# Patient Record
Sex: Female | Born: 1950 | Race: Black or African American | Hispanic: No | Marital: Single | Smoking: Former smoker
Health system: Southern US, Community
[De-identification: ages and names within clinical notes are randomized; demographics above are authoritative.]

## PROBLEM LIST (undated history)

## (undated) DIAGNOSIS — I4510 Unspecified right bundle-branch block: Secondary | ICD-10-CM

## (undated) DIAGNOSIS — R7303 Prediabetes: Secondary | ICD-10-CM

## (undated) DIAGNOSIS — Z8489 Family history of other specified conditions: Secondary | ICD-10-CM

## (undated) DIAGNOSIS — J45909 Unspecified asthma, uncomplicated: Secondary | ICD-10-CM

## (undated) DIAGNOSIS — K219 Gastro-esophageal reflux disease without esophagitis: Secondary | ICD-10-CM

## (undated) DIAGNOSIS — E119 Type 2 diabetes mellitus without complications: Secondary | ICD-10-CM

## (undated) DIAGNOSIS — Z973 Presence of spectacles and contact lenses: Secondary | ICD-10-CM

## (undated) DIAGNOSIS — I10 Essential (primary) hypertension: Secondary | ICD-10-CM

## (undated) DIAGNOSIS — M1990 Unspecified osteoarthritis, unspecified site: Secondary | ICD-10-CM

## (undated) DIAGNOSIS — N939 Abnormal uterine and vaginal bleeding, unspecified: Secondary | ICD-10-CM

## (undated) HISTORY — PX: DILATION AND CURETTAGE OF UTERUS: SHX78

## (undated) HISTORY — PX: JOINT REPLACEMENT: SHX530

---

## 2002-08-10 ENCOUNTER — Encounter: Admission: RE | Admit: 2002-08-10 | Discharge: 2002-08-10 | Payer: Self-pay | Admitting: Obstetrics & Gynecology

## 2002-08-10 ENCOUNTER — Encounter: Payer: Self-pay | Admitting: Obstetrics & Gynecology

## 2005-01-19 ENCOUNTER — Encounter: Admission: RE | Admit: 2005-01-19 | Discharge: 2005-01-19 | Payer: Self-pay | Admitting: Internal Medicine

## 2006-03-29 ENCOUNTER — Encounter: Admission: RE | Admit: 2006-03-29 | Discharge: 2006-03-29 | Payer: Self-pay | Admitting: Internal Medicine

## 2007-10-18 ENCOUNTER — Encounter: Admission: RE | Admit: 2007-10-18 | Discharge: 2007-10-18 | Payer: Self-pay | Admitting: Internal Medicine

## 2010-12-17 ENCOUNTER — Inpatient Hospital Stay (HOSPITAL_COMMUNITY)
Admission: AD | Admit: 2010-12-17 | Discharge: 2010-12-17 | Payer: Self-pay | Source: Home / Self Care | Attending: Obstetrics and Gynecology | Admitting: Obstetrics and Gynecology

## 2010-12-21 LAB — CBC
HCT: 42.7 % (ref 36.0–46.0)
Hemoglobin: 14.4 g/dL (ref 12.0–15.0)
MCH: 27 pg (ref 26.0–34.0)
MCHC: 33.7 g/dL (ref 30.0–36.0)
MCV: 80.1 fL (ref 78.0–100.0)
Platelets: 221 10*3/uL (ref 150–400)
RBC: 5.33 MIL/uL — ABNORMAL HIGH (ref 3.87–5.11)
RDW: 15.7 % — ABNORMAL HIGH (ref 11.5–15.5)
WBC: 14.9 10*3/uL — ABNORMAL HIGH (ref 4.0–10.5)

## 2010-12-21 LAB — URINALYSIS, ROUTINE W REFLEX MICROSCOPIC
Bilirubin Urine: NEGATIVE
Ketones, ur: NEGATIVE mg/dL
Nitrite: NEGATIVE
Protein, ur: 100 mg/dL — AB
Specific Gravity, Urine: 1.025 (ref 1.005–1.030)
Urine Glucose, Fasting: NEGATIVE mg/dL
Urobilinogen, UA: 1 mg/dL (ref 0.0–1.0)
pH: 6 (ref 5.0–8.0)

## 2010-12-21 LAB — DIFFERENTIAL
Basophils Absolute: 0 10*3/uL (ref 0.0–0.1)
Basophils Relative: 0 % (ref 0–1)
Eosinophils Absolute: 0 10*3/uL (ref 0.0–0.7)
Eosinophils Relative: 0 % (ref 0–5)
Lymphocytes Relative: 14 % (ref 12–46)
Lymphs Abs: 2.1 10*3/uL (ref 0.7–4.0)
Monocytes Absolute: 1 10*3/uL (ref 0.1–1.0)
Monocytes Relative: 7 % (ref 3–12)
Neutro Abs: 11.8 10*3/uL — ABNORMAL HIGH (ref 1.7–7.7)
Neutrophils Relative %: 79 % — ABNORMAL HIGH (ref 43–77)

## 2010-12-21 LAB — URINE MICROSCOPIC-ADD ON

## 2010-12-26 ENCOUNTER — Encounter: Payer: Self-pay | Admitting: Internal Medicine

## 2010-12-27 ENCOUNTER — Encounter: Payer: Self-pay | Admitting: Internal Medicine

## 2011-01-01 ENCOUNTER — Other Ambulatory Visit: Payer: Self-pay | Admitting: Obstetrics and Gynecology

## 2011-01-01 ENCOUNTER — Other Ambulatory Visit
Admission: RE | Admit: 2011-01-01 | Discharge: 2011-01-01 | Payer: Self-pay | Source: Home / Self Care | Admitting: Obstetrics and Gynecology

## 2011-01-01 ENCOUNTER — Ambulatory Visit
Admission: RE | Admit: 2011-01-01 | Discharge: 2011-01-01 | Payer: Self-pay | Source: Home / Self Care | Attending: Obstetrics and Gynecology | Admitting: Obstetrics and Gynecology

## 2011-01-01 LAB — POCT PREGNANCY, URINE: Preg Test, Ur: NEGATIVE

## 2011-01-02 NOTE — Progress Notes (Signed)
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  The patient was seen in the MAU on December 17, 2010, after stopping her Depo-Provera with episodes of heavy vaginal bleeding, and lower abdominal cramping pain.  On that day the patient had an ultrasound which demonstrated a uterus measuring 12 x 6 x 7 cm.  The endometrial lining was homogeneous and measured 7.8 mm.  Endocervical canal was distended with a heterogeneous soft tissue measuring 4 x 8 cm which was suggestive of blood products or blood clots versus polyp or aborting submucosal fibroid.  The left and right ovaries were not visualized.  The patient was discharged from the MAU with a prescription for Provera today.  The patient states that she after taking the Provera her bleeding never really.  So she saw her primary care physician who gave her another dose of Depo-Provera and her bleeding has since stopped.  She reports occasional spotting, but a definite improvement since her MAU visit.  The patient is currently without any complaints.  PAST MEDICAL HISTORY:  Significant for asthma, hypertension, bronchitis, and arthritis.  PAST SURGICAL HISTORY:  She denies.  PAST OB HISTORY:  She has had four full-term vaginal deliveries.  PAST GYN HISTORY:  She denies any cyst or fibroids, but does report a history of abnormal Pap smear last year for which she was supposed to be followed up with colposcopy but failed to do so.  FAMILY HISTORY:  Significant for diabetes in her father and her mother deceased at the age of 48  from breast cancer.  SOCIAL HISTORY:  She denies drinking, smoking, or the use of illicit drugs.  REVIEW OF SYSTEMS:  Otherwise within normal limits.  The patient denies any vasomotor symptoms or irritability or mood swings.  PHYSICAL EXAMINATION:  VITAL SIGNS:  Her blood pressure is 133/91, pulse of 97, weight of 108.8 kg, height of 69 inches. ABDOMEN:  Soft, nontender, and nondistended. PELVIC:  She had normal-appearing vaginal mucosa and cervix which appeared very friable.  No abnormal bleeding or discharge.  Pelvic exam, she had approximately 14-week size uterus.  No palpable adnexal masses or tenderness.  ASSESSMENT/PLAN:  This is a 60 year old para 4 with longstanding history of dysfunctional uterine bleeding, medically managed on Depo-Provera who presents today as an MAU followup.  The patient was counseled for the need of endometrial biopsy to rule out a malignancy.  After informed consent was obtained, the patient was placed in dorsal lithotomy position and prepped with Betadine solution.  The uterine cavity was sounded to 9 cm and endometrial biopsy was obtained on first pass.  A Pap smear was performed prior to the endometrial biopsy given her history of abnormal Pap  smear with lack of followup.  The patient tolerated the procedure well and will return in 2 weeks to discuss the results and further management of her dysfunctional uterine bleeding.          ______________________________ Catalina Antigua, MD    PC/MEDQ  D:  01/01/2011  T:  01/02/2011  Job:  [id number]

## 2011-01-06 ENCOUNTER — Encounter: Payer: Self-pay | Admitting: Internal Medicine

## 2011-01-15 ENCOUNTER — Ambulatory Visit: Payer: Self-pay | Admitting: Obstetrics & Gynecology

## 2011-01-29 ENCOUNTER — Ambulatory Visit (INDEPENDENT_AMBULATORY_CARE_PROVIDER_SITE_OTHER): Payer: Medicaid Other | Admitting: Obstetrics and Gynecology

## 2011-01-29 ENCOUNTER — Other Ambulatory Visit: Payer: Self-pay | Admitting: Obstetrics and Gynecology

## 2011-01-29 DIAGNOSIS — Z1231 Encounter for screening mammogram for malignant neoplasm of breast: Secondary | ICD-10-CM

## 2011-01-29 DIAGNOSIS — N949 Unspecified condition associated with female genital organs and menstrual cycle: Secondary | ICD-10-CM

## 2011-01-29 DIAGNOSIS — N925 Other specified irregular menstruation: Secondary | ICD-10-CM

## 2011-02-05 ENCOUNTER — Ambulatory Visit (HOSPITAL_COMMUNITY)
Admission: RE | Admit: 2011-02-05 | Discharge: 2011-02-05 | Disposition: A | Discharge status: Home / Self Care | Payer: Medicaid Other | Source: Ambulatory Visit | Attending: Obstetrics and Gynecology | Admitting: Obstetrics and Gynecology

## 2011-02-05 DIAGNOSIS — Z1231 Encounter for screening mammogram for malignant neoplasm of breast: Secondary | ICD-10-CM | POA: Insufficient documentation

## 2011-02-26 NOTE — Progress Notes (Signed)
NAME:  Margaret Cisneros, Margaret Cisneros NO.:  [id number]  MEDICAL RECORD NO.:  [id number]           PATIENT TYPE:  A  LOCATION:  [facility]                   FACILITY:  [facility]  PHYSICIAN:  Catalina Antigua, MD     DATE OF BIRTH:  1951-07-22  DATE OF SERVICE:  01/29/2011                                 CLINIC NOTE  This is a 61 year old, G4, P4, with longstanding history of dysfunctional uterine bleeding currently managed with Depo-Provera who presents today for discussion of the results of her endometrial biopsy and Pap smear.  Results of the Pap smear which was normal were reviewed with the patient as well as a normal endometrial biopsy with no evidence of hyperplasia or malignancy were explained to the patient.  The patient verbalized understanding and was relieved to hear these results.  The patient states that her bleeding has significantly improved since the Depo-Provera and she wishes to continue on the Depo-Provera.  She describes her bleeding pattern as occasional spotting.  The patient states that she will continue evaluating her vaginal bleeding and if it becomes problematic that she may consider endometrial ablation.  The patient is also due for a mammogram for which a referral was provided for her.  The patient will return in a year or p.r.n. for annual exam and further management of her dysfunctional uterine bleeding if indicated.          ______________________________ Catalina Antigua, MD    PC/MEDQ  D:  01/29/2011  T:  01/30/2011  Job:  [id number]

## 2011-03-12 ENCOUNTER — Ambulatory Visit: Payer: Medicaid Other

## 2011-03-15 ENCOUNTER — Other Ambulatory Visit: Payer: Self-pay | Admitting: Family Medicine

## 2011-03-15 ENCOUNTER — Ambulatory Visit (INDEPENDENT_AMBULATORY_CARE_PROVIDER_SITE_OTHER): Payer: Medicaid Other | Admitting: Family Medicine

## 2011-03-15 DIAGNOSIS — N938 Other specified abnormal uterine and vaginal bleeding: Secondary | ICD-10-CM

## 2011-03-15 DIAGNOSIS — N949 Unspecified condition associated with female genital organs and menstrual cycle: Secondary | ICD-10-CM

## 2011-03-15 LAB — POCT URINALYSIS DIP (DEVICE)
Bilirubin Urine: NEGATIVE
Glucose, UA: NEGATIVE mg/dL
Ketones, ur: NEGATIVE mg/dL
Nitrite: NEGATIVE
Protein, ur: 30 mg/dL — AB
Specific Gravity, Urine: 1.025 (ref 1.005–1.030)
Urobilinogen, UA: 0.2 mg/dL (ref 0.0–1.0)
pH: 5.5 (ref 5.0–8.0)

## 2011-03-16 NOTE — Progress Notes (Signed)
NAME:  Margaret Cisneros, Margaret Cisneros NO.:  [id number]  MEDICAL RECORD NO.:  [id number]           PATIENT TYPE:  A  LOCATION:  [facility]                   FACILITY:  [facility]  PHYSICIAN:  Maryelizabeth Kaufmann, MD  DATE OF BIRTH:  September 02, 1951  DATE OF SERVICE:  03/15/2011                                 CLINIC NOTE  CHIEF COMPLAINT:  Continued dysfunctional uterine bleeding.  HISTORY OF PRESENT ILLNESS:  This is a 60 year old gravida 4, para 3-1-0- 4 who presents with dysfunctional uterine bleeding.  The patient states she has been on Depo-Provera previously and then had not since for the past 2 or 3 years because she states that her previous primary care doctor took her off this because she was getting throughout.  She had not had any periods since that time up until the time November 23, 2010. Before she states that she bled about a month, which resulted in her first visit on December 17, 2010, to December 24, 2010, at which time she got her first Depo-Provera injection on December 24, 2010.  She states that she is now due for her second injection.  She continues to complaining of some irregular bleeding and using about 2-3 packs a day, some occasional clots, but she states that her bleeding is better at night.  She denies any history of aspirin and bleeding problems.  She denies any hot flashes, mood changes, breast symptoms,  constipation, diarrhea, weight loss, weight gain, or any other symptoms or problems. Her last EMB was consistent with benign and active endometrium consistent with exogenous hormone affect.  No atypia, dysplasia, or malignancy, and her Pap smear was normal.  The patient also states that she thinks that ciprofloxacin may be causing her bleeding as well; however, she took it for a urinary tract infection has not had it for the past month or so.  PHYSICAL EXAMINATION:  VITAL SIGNS:  Blood pressure 134/84, pulse 109, temperature 98.3, weight 241.9, height 69  inches. GENERAL:  The patient is in no acute distress. CHEST:  Clear to auscultation bilaterally.  No wheezes, rales, or rhonchi. CARDIOVASCULAR:  Regular rate and rhythm.  No murmurs, rubs, or gallops. NECK:  No lymphadenopathy.  Thyroid without any enlargement or masses or tenderness. ABDOMEN:  Positive bowel sounds.  Soft, nontender, nondistended. EXTREMITIES:  No clubbing, cyanosis, with trace pitting edema bilaterally. GU:  Normal external genitalia.  Sterile speculum exam:  Mild vaginal atrophy. Cervix appeared to be normal, closed with mild bleeding noted about the cervical os and some blood in the vault.  Bimanual exam: Uterus appeared to be anteverted about at the most 6-week size, but exam is limited due to body habitus.  Otherwise, adnexa without any masses or tenderness.  ASSESSMENT AND PLAN:  This is a 60 year old gravida 4, para 3-1-0-4 with history of Depo-Provera likely with progesterone either breakthrough bleeding or possible postmenopausal bleeding, however, the patient does not have any signs or symptoms with postmenopause including hot flashes or any other symptoms.  So, the plan at this time: 1. To order transvaginal ultrasound. 2. To check a CBC. 3. To order LH and FSH ratio. 4. To  order an INR to rule out any type of coagulopathy. 5. We will give the patient her Depo-Provera injection today.     Discussed with the patient that she will likely need to have at     least this one injection, and may be one more to resolve any     appreciable difference, currently concern for malignancy is low;     but if the patient continues to have bleeding on Depo-Provera and     due to her age, she may need a D and C as well.  So, regarding her     problem, we will have the patient follow up in 2 weeks with a     surgeon for possible surgical consult for D and C if she continues     to have persistent bleeding.          ______________________________ Maryelizabeth Kaufmann,  MD    LC/MEDQ  D:  03/15/2011  T:  03/16/2011  Job:  [id number]

## 2011-03-19 ENCOUNTER — Ambulatory Visit (HOSPITAL_COMMUNITY)
Admission: RE | Admit: 2011-03-19 | Discharge: 2011-03-19 | Disposition: A | Discharge status: Home / Self Care | Payer: Medicaid Other | Source: Ambulatory Visit | Attending: Family Medicine | Admitting: Family Medicine

## 2011-03-19 DIAGNOSIS — D251 Intramural leiomyoma of uterus: Secondary | ICD-10-CM | POA: Insufficient documentation

## 2011-03-19 DIAGNOSIS — N938 Other specified abnormal uterine and vaginal bleeding: Secondary | ICD-10-CM

## 2011-03-19 DIAGNOSIS — N949 Unspecified condition associated with female genital organs and menstrual cycle: Secondary | ICD-10-CM | POA: Insufficient documentation

## 2011-04-08 ENCOUNTER — Ambulatory Visit (INDEPENDENT_AMBULATORY_CARE_PROVIDER_SITE_OTHER): Payer: Medicaid Other | Admitting: Obstetrics and Gynecology

## 2011-04-08 DIAGNOSIS — N949 Unspecified condition associated with female genital organs and menstrual cycle: Secondary | ICD-10-CM

## 2011-04-09 NOTE — Group Therapy Note (Signed)
NAME:  Margaret Cisneros, Margaret Cisneros NO.:  [id number]  MEDICAL RECORD NO.:  [id number]           PATIENT TYPE:  A  LOCATION:  [facility]                   FACILITY:  [facility]  PHYSICIAN:  Catalina Antigua, MD     DATE OF BIRTH:  [birth year]  DATE OF SERVICE:  04/08/2011                                 CLINIC NOTE  This is a 60 year old gravida 4, para 3-1-0-4 with history of dysfunctional uterine bleeding, currently managed on Depo-Provera who presents today to discuss the results of the blood work performed at the last visit.  The patient has a longstanding history of dysfunctional uterine bleeding, previously managed on Depo-Provera well, but her primary care physician discontinued her Depo-Provera.  The patient started experienced heavy vaginal bleeding in January 2012.  Endometrial biopsy performed at that time was negative for hyperplasia or malignancy.  The patient was restarted on Depo-Provera and states that her bleeding is extremely well controlled and would like to continue with this current management at this time.  All the laboratory values were normal for the CBC, LH, FSH, and INR.  The results were explained to the patient.  The patient verbalized understanding.  All questions were answered.  The patient will return in approximately 8 weeks for Depo-Provera and in January for annual exam.          ______________________________ Catalina Antigua, MD    PC/MEDQ  D:  04/08/2011  T:  04/09/2011  Job:  [id number]

## 2011-05-31 ENCOUNTER — Ambulatory Visit: Payer: Medicaid Other

## 2011-06-04 ENCOUNTER — Ambulatory Visit (INDEPENDENT_AMBULATORY_CARE_PROVIDER_SITE_OTHER): Payer: Medicaid Other

## 2011-06-04 DIAGNOSIS — Z3049 Encounter for surveillance of other contraceptives: Secondary | ICD-10-CM

## 2011-08-20 ENCOUNTER — Ambulatory Visit (INDEPENDENT_AMBULATORY_CARE_PROVIDER_SITE_OTHER): Payer: Medicaid Other | Admitting: *Deleted

## 2011-08-20 VITALS — BP 144/87 | HR 96

## 2011-08-20 DIAGNOSIS — N938 Other specified abnormal uterine and vaginal bleeding: Secondary | ICD-10-CM

## 2011-08-20 DIAGNOSIS — N949 Unspecified condition associated with female genital organs and menstrual cycle: Secondary | ICD-10-CM

## 2011-08-20 MED ORDER — MEDROXYPROGESTERONE ACETATE 150 MG/ML IM SUSP
150.0000 mg | Freq: Once | INTRAMUSCULAR | Status: AC
  Administered 2011-08-20: 150 mg via INTRAMUSCULAR

## 2011-11-05 ENCOUNTER — Ambulatory Visit (INDEPENDENT_AMBULATORY_CARE_PROVIDER_SITE_OTHER): Payer: Medicaid Other | Admitting: *Deleted

## 2011-11-05 VITALS — BP 118/77

## 2011-11-05 DIAGNOSIS — N949 Unspecified condition associated with female genital organs and menstrual cycle: Secondary | ICD-10-CM

## 2011-11-05 DIAGNOSIS — N938 Other specified abnormal uterine and vaginal bleeding: Secondary | ICD-10-CM

## 2011-11-05 DIAGNOSIS — N925 Other specified irregular menstruation: Secondary | ICD-10-CM

## 2011-11-05 MED ORDER — MEDROXYPROGESTERONE ACETATE 150 MG/ML IM SUSP
150.0000 mg | Freq: Once | INTRAMUSCULAR | Status: AC
  Administered 2011-11-05: 150 mg via INTRAMUSCULAR

## 2012-01-28 ENCOUNTER — Ambulatory Visit (INDEPENDENT_AMBULATORY_CARE_PROVIDER_SITE_OTHER): Payer: Medicaid Other | Admitting: *Deleted

## 2012-01-28 VITALS — BP 120/80 | HR 92 | Ht 66.0 in | Wt 240.7 lb

## 2012-01-28 DIAGNOSIS — N939 Abnormal uterine and vaginal bleeding, unspecified: Secondary | ICD-10-CM

## 2012-01-28 DIAGNOSIS — N938 Other specified abnormal uterine and vaginal bleeding: Secondary | ICD-10-CM

## 2012-01-28 DIAGNOSIS — Z01812 Encounter for preprocedural laboratory examination: Secondary | ICD-10-CM

## 2012-01-28 MED ORDER — MEDROXYPROGESTERONE ACETATE 150 MG/ML IM SUSP
150.0000 mg | Freq: Once | INTRAMUSCULAR | Status: AC
  Administered 2012-01-28: 150 mg via INTRAMUSCULAR

## 2012-01-28 NOTE — Progress Notes (Signed)
Discussed with patient is late for shot( has been [redacted]w[redacted]d), patient states last intercourse 01/07/12 with condoms, upt done today =negative.  Also per chart review last md exam 12/2010. Discussed with Dr. Jolayne Panther - patient may have shot today with negative upt and last intercourse protected and > 2 weeks. Patient instructed must make next appointment for annual exam/ depoprovera/discuss plan of care 04/14/12 - 04/28/12. Patient voices understanding.

## 2012-03-20 ENCOUNTER — Telehealth: Payer: Self-pay | Admitting: *Deleted

## 2012-03-20 NOTE — Telephone Encounter (Signed)
Patient called states received Depo shot on Feb 22 and is still bleeding. Would like call back in reference to this.

## 2012-03-21 NOTE — Telephone Encounter (Signed)
Called pt and discussed her concerns. She states that she has continued to have vaginal bleeding off and on since her Depo injection on 01/28/12. Some days it is only spotting, then on other days it is heavier but never extremely heavy. She also has abdominal cramping similar to pre-menstrual cramps. I explained that this happens to some patients and is not worrisome but I understand she is frustrated with the situation. She agreed and further stated that the bleeding had not stopped after her injection in November either.  I advised pt to discuss her concerns and experience @ her next visit on 04/14/12 prior to getting her next injection. There are other treatment possibilities which she may wish to consider. I also instructed her to go to MAU for evaluation of her bleeding becomes 1 or more pads/hr x3 consecutive hours. She voiced understanding.

## 2012-04-14 ENCOUNTER — Ambulatory Visit: Payer: Medicaid Other | Admitting: Family Medicine

## 2012-04-14 ENCOUNTER — Ambulatory Visit: Payer: Self-pay

## 2012-04-14 ENCOUNTER — Ambulatory Visit: Payer: Medicaid Other

## 2012-05-10 ENCOUNTER — Ambulatory Visit: Payer: Medicaid Other | Admitting: Family Medicine

## 2013-12-10 ENCOUNTER — Ambulatory Visit: Payer: Medicaid Other

## 2014-02-09 ENCOUNTER — Ambulatory Visit: Payer: Medicaid Other

## 2014-03-08 ENCOUNTER — Ambulatory Visit: Payer: Medicaid Other | Admitting: Internal Medicine

## 2014-03-13 ENCOUNTER — Ambulatory Visit: Payer: Medicaid Other | Admitting: Internal Medicine

## 2014-05-08 ENCOUNTER — Ambulatory Visit: Payer: Medicaid Other | Admitting: Internal Medicine

## 2017-01-21 ENCOUNTER — Other Ambulatory Visit: Payer: Self-pay | Admitting: Internal Medicine

## 2017-01-21 DIAGNOSIS — Z1231 Encounter for screening mammogram for malignant neoplasm of breast: Secondary | ICD-10-CM

## 2017-02-08 ENCOUNTER — Other Ambulatory Visit: Payer: Self-pay | Admitting: Internal Medicine

## 2017-02-08 ENCOUNTER — Ambulatory Visit: Payer: Medicaid Other

## 2017-02-08 DIAGNOSIS — E2839 Other primary ovarian failure: Secondary | ICD-10-CM

## 2017-03-03 ENCOUNTER — Ambulatory Visit: Payer: Medicaid Other

## 2017-03-03 ENCOUNTER — Other Ambulatory Visit: Payer: Medicaid Other

## 2017-12-07 ENCOUNTER — Other Ambulatory Visit: Payer: Self-pay | Admitting: Orthopedic Surgery

## 2017-12-07 DIAGNOSIS — M5126 Other intervertebral disc displacement, lumbar region: Secondary | ICD-10-CM

## 2017-12-19 ENCOUNTER — Other Ambulatory Visit: Payer: Self-pay

## 2017-12-20 ENCOUNTER — Ambulatory Visit
Admission: RE | Admit: 2017-12-20 | Discharge: 2017-12-20 | Disposition: A | Discharge status: Home / Self Care | Payer: BLUE CROSS/BLUE SHIELD | Source: Ambulatory Visit | Attending: Orthopedic Surgery | Admitting: Orthopedic Surgery

## 2017-12-20 DIAGNOSIS — M5126 Other intervertebral disc displacement, lumbar region: Secondary | ICD-10-CM

## 2017-12-20 MED ORDER — IOPAMIDOL (ISOVUE-M 200) INJECTION 41%
15.0000 mL | Freq: Once | INTRAMUSCULAR | Status: AC
  Administered 2017-12-20: 15 mL via INTRATHECAL

## 2017-12-20 MED ORDER — ONDANSETRON HCL 4 MG/2ML IJ SOLN
4.0000 mg | Freq: Four times a day (QID) | INTRAMUSCULAR | Status: DC | PRN
Start: 2017-12-20 — End: 2017-12-21

## 2017-12-20 MED ORDER — DIAZEPAM 5 MG PO TABS
10.0000 mg | ORAL_TABLET | Freq: Once | ORAL | Status: AC
  Administered 2017-12-20: 10 mg via ORAL

## 2017-12-20 NOTE — Discharge Instructions (Signed)

## 2018-03-22 ENCOUNTER — Ambulatory Visit: Payer: Self-pay | Admitting: Orthopedic Surgery

## 2018-03-28 ENCOUNTER — Other Ambulatory Visit (HOSPITAL_COMMUNITY): Payer: Self-pay | Admitting: Emergency Medicine

## 2018-03-28 NOTE — Patient Instructions (Signed)
Margaret Cisneros  03/28/2018   Your procedure is scheduled on: 04-06-18   Report to [facility] Main  Entrance    Report to admitting at 1:30PM    Call this number if you have problems the morning of surgery [phone]     Remember: Do not eat food After Midnight. YOU MAY HAVE CLEAR LIQUIDS FROM MIDNIGHT UNTIL 10AM DAY OF SURGERY. NOTHING BY MOUTH AFTER 10AM!     Take these medicines the morning of surgery with A SIP OF WATER: OMEPRAZOLE, ALBUTEROL INHALER IF NEEDED (PLEASE BRING)                                You may not have any metal on your body including hair pins and              piercings  Do not wear jewelry, make-up, lotions, powders or perfumes, deodorant             Do not wear nail polish.  Do not shave  48 hours prior to surgery.          Do not bring valuables to the hospital. [place].  Contacts, dentures or bridgework may not be worn into surgery.  Leave suitcase in the car. After surgery it may be brought to your room.                 Please read over the following fact sheets you were given: _____________________________________________________________________     CLEAR LIQUID DIET   Foods Allowed                                                                     Foods Excluded  Coffee and tea, regular and decaf                             liquids that you cannot  Plain Jell-O in any flavor                                             see through such as: Fruit ices (not with fruit pulp)                                     milk, soups, orange juice  Iced Popsicles                                    All solid food Carbonated beverages, regular and diet                                    Cranberry, grape  and apple juices Sports drinks like Gatorade Lightly seasoned clear broth or consume(fat free) Sugar, honey syrup  Sample Menu Breakfast                                Lunch                                      Supper Cranberry juice                    Beef broth                            Chicken broth Jell-O                                     Grape juice                           Apple juice Coffee or tea                        Jell-O                                      Popsicle                                                Coffee or tea                        Coffee or tea  _____________________________________________________________________              [facility] - Preparing for Surgery Before surgery, you can play an important role.  Because skin is not sterile, your skin needs to be as free of germs as possible.  You can reduce the number of germs on your skin by washing with CHG (chlorahexidine gluconate) soap before surgery.  CHG is an antiseptic cleaner which kills germs and bonds with the skin to continue killing germs even after washing. Please DO NOT use if you have an allergy to CHG or antibacterial soaps.  If your skin becomes reddened/irritated stop using the CHG and inform your nurse when you arrive at Short Stay. Do not shave (including legs and underarms) for at least 48 hours prior to the first CHG shower.  You may shave your face/neck. Please follow these instructions carefully:  1.  Shower with CHG Soap the night before surgery and the  morning of Surgery.  2.  If you choose to wash your hair, wash your hair first as usual with your  normal  shampoo.  3.  After you shampoo, rinse your hair and body thoroughly to remove the  shampoo.                           4.  Use CHG as you would any other liquid soap.  You can apply chg directly  to the skin and wash                       Gently with a scrungie or clean washcloth.  5.  Apply the CHG Soap to your body ONLY FROM THE NECK DOWN.   Do not use on face/ open                           Wound or open sores. Avoid contact with eyes, ears mouth and genitals (private parts).                       Wash  face,  Genitals (private parts) with your normal soap.             6.  Wash thoroughly, paying special attention to the area where your surgery  will be performed.  7.  Thoroughly rinse your body with warm water from the neck down.  8.  DO NOT shower/wash with your normal soap after using and rinsing off  the CHG Soap.                9.  Pat yourself dry with a clean towel.            10.  Wear clean pajamas.            11.  Place clean sheets on your bed the night of your first shower and do not  sleep with pets. Day of Surgery : Do not apply any lotions/deodorants the morning of surgery.  Please wear clean clothes to the hospital/surgery center.  FAILURE TO FOLLOW THESE INSTRUCTIONS MAY RESULT IN THE CANCELLATION OF YOUR SURGERY PATIENT SIGNATURE_________________________________  NURSE SIGNATURE__________________________________  ________________________________________________________________________  WHAT IS A BLOOD TRANSFUSION? Blood Transfusion Information  A transfusion is the replacement of blood or some of its parts. Blood is made up of multiple cells which provide different functions.  Red blood cells carry oxygen and are used for blood loss replacement.  White blood cells fight against infection.  Platelets control bleeding.  Plasma helps clot blood.  Other blood products are available for specialized needs, such as hemophilia or other clotting disorders. BEFORE THE TRANSFUSION  Who gives blood for transfusions?   Healthy volunteers who are fully evaluated to make sure their blood is safe. This is blood bank blood. Transfusion therapy is the safest it has ever been in the practice of medicine. Before blood is taken from a donor, a complete history is taken to make sure that person has no history of diseases nor engages in risky social behavior (examples are intravenous drug use or sexual activity with multiple partners). The donor's travel history is screened to minimize  risk of transmitting infections, such as malaria. The donated blood is tested for signs of infectious diseases, such as HIV and hepatitis. The blood is then tested to be sure it is compatible with you in order to minimize the chance of a transfusion reaction. If you or a relative donates blood, this is often done in anticipation of surgery and is not appropriate for emergency situations. It takes many days to process the donated blood. RISKS AND COMPLICATIONS Although transfusion therapy is very safe and saves many lives, the main dangers of transfusion include:   Getting an infectious disease.  Developing a transfusion reaction. This is an allergic reaction to something in the blood you were given. Every precaution is taken to  prevent this. The decision to have a blood transfusion has been considered carefully by your caregiver before blood is given. Blood is not given unless the benefits outweigh the risks. AFTER THE TRANSFUSION  Right after receiving a blood transfusion, you will usually feel much better and more energetic. This is especially true if your red blood cells have gotten low (anemic). The transfusion raises the level of the red blood cells which carry oxygen, and this usually causes an energy increase.  The nurse administering the transfusion will monitor you carefully for complications. HOME CARE INSTRUCTIONS  No special instructions are needed after a transfusion. You may find your energy is better. Speak with your caregiver about any limitations on activity for underlying diseases you may have. SEEK MEDICAL CARE IF:   Your condition is not improving after your transfusion.  You develop redness or irritation at the intravenous (IV) site. SEEK IMMEDIATE MEDICAL CARE IF:  Any of the following symptoms occur over the next 12 hours:  Shaking chills.  You have a temperature by mouth above 102 F (38.9 C), not controlled by medicine.  Chest, back, or muscle pain.  People  around you feel you are not acting correctly or are confused.  Shortness of breath or difficulty breathing.  Dizziness and fainting.  You get a rash or develop hives.  You have a decrease in urine output.  Your urine turns a dark color or changes to pink, red, or brown. Any of the following symptoms occur over the next 10 days:  You have a temperature by mouth above 102 F (38.9 C), not controlled by medicine.  Shortness of breath.  Weakness after normal activity.  The white part of the eye turns yellow (jaundice).  You have a decrease in the amount of urine or are urinating less often.  Your urine turns a dark color or changes to pink, red, or brown. Document Released: 11/19/2000 Document Revised: 02/14/2012 Document Reviewed: 07/08/2008 [facility] Patient Information 2014 [place], [place].  _______________________________________________________________________

## 2018-03-29 ENCOUNTER — Encounter (HOSPITAL_COMMUNITY)
Admission: RE | Admit: 2018-03-29 | Discharge: 2018-03-29 | Disposition: A | Discharge status: Home / Self Care | Payer: Medicare HMO | Source: Ambulatory Visit | Attending: Orthopedic Surgery | Admitting: Orthopedic Surgery

## 2018-03-29 ENCOUNTER — Ambulatory Visit: Payer: Self-pay | Admitting: Orthopedic Surgery

## 2018-03-29 ENCOUNTER — Other Ambulatory Visit: Payer: Self-pay

## 2018-03-29 ENCOUNTER — Encounter (HOSPITAL_COMMUNITY): Payer: Self-pay | Admitting: Emergency Medicine

## 2018-03-29 DIAGNOSIS — Z01812 Encounter for preprocedural laboratory examination: Secondary | ICD-10-CM | POA: Diagnosis present

## 2018-03-29 DIAGNOSIS — M1612 Unilateral primary osteoarthritis, left hip: Secondary | ICD-10-CM | POA: Insufficient documentation

## 2018-03-29 HISTORY — DX: Essential (primary) hypertension: I10

## 2018-03-29 HISTORY — DX: Unspecified osteoarthritis, unspecified site: M19.90

## 2018-03-29 HISTORY — DX: Gastro-esophageal reflux disease without esophagitis: K21.9

## 2018-03-29 HISTORY — DX: Prediabetes: R73.03

## 2018-03-29 HISTORY — DX: Unspecified asthma, uncomplicated: J45.909

## 2018-03-29 LAB — CBC
HEMATOCRIT: 42.8 % (ref 36.0–46.0)
Hemoglobin: 14 g/dL (ref 12.0–15.0)
MCH: 27.6 pg (ref 26.0–34.0)
MCHC: 32.7 g/dL (ref 30.0–36.0)
MCV: 84.3 fL (ref 78.0–100.0)
PLATELETS: 216 10*3/uL (ref 150–400)
RBC: 5.08 MIL/uL (ref 3.87–5.11)
RDW: 15.9 % — AB (ref 11.5–15.5)
WBC: 7.6 10*3/uL (ref 4.0–10.5)

## 2018-03-29 LAB — BASIC METABOLIC PANEL
Anion gap: 10 (ref 5–15)
BUN: 12 mg/dL (ref 6–20)
CALCIUM: 9.1 mg/dL (ref 8.9–10.3)
CO2: 23 mmol/L (ref 22–32)
Chloride: 106 mmol/L (ref 101–111)
Creatinine, Ser: 0.76 mg/dL (ref 0.44–1.00)
Glucose, Bld: 122 mg/dL — ABNORMAL HIGH (ref 65–99)
Potassium: 3.6 mmol/L (ref 3.5–5.1)
SODIUM: 139 mmol/L (ref 135–145)

## 2018-03-29 LAB — HEMOGLOBIN A1C
HEMOGLOBIN A1C: 6 % — AB (ref 4.8–5.6)
Mean Plasma Glucose: 125.5 mg/dL

## 2018-03-29 LAB — SURGICAL PCR SCREEN
MRSA, PCR: NEGATIVE
STAPHYLOCOCCUS AUREUS: NEGATIVE

## 2018-03-29 LAB — ABO/RH: ABO/RH(D): AB POS

## 2018-03-29 NOTE — H&P (Signed)
TOTAL HIP ADMISSION H&P  Patient is admitted for left total hip arthroplasty.  Subjective:  Chief Complaint: left hip pain  HPI: Margaret Cisneros, 67 y.o. female, has a history of pain and functional disability in the left hip(s) due to arthritis and patient has failed non-surgical conservative treatments for greater than 12 weeks to include NSAID's and/or analgesics, flexibility and strengthening excercises, use of assistive devices, weight reduction as appropriate and activity modification.  Onset of symptoms was gradual starting 2 years ago with gradually worsening course since that time.The patient noted no past surgery on the left hip(s).  Patient currently rates pain in the left hip at 10 out of 10 with activity. Patient has night pain, worsening of pain with activity and weight bearing and pain with passive range of motion. Patient has evidence of subchondral cysts, subchondral sclerosis, periarticular osteophytes and joint space narrowing by imaging studies. This condition presents safety issues increasing the risk of falls. There is no current active infection.  There are no active problems to display for this patient.  Past Medical History:  Diagnosis Date  . Arthritis    right knee arthritis uses celbrex to treat   . Asthma   . GERD (gastroesophageal reflux disease)    severe   . Hypertension   . Pre-diabetes     Past Surgical History:  Procedure Laterality Date  . NO PAST SURGERIES      No current facility-administered medications for this visit.    No current outpatient medications on file.   Facility-Administered Medications Ordered in Other Visits  Medication Dose Route Frequency Provider Last Rate Last Dose  . 0.9 %  sodium chloride infusion   Intravenous Continuous Ady Heimann, Aaron Edelman, MD      . acetaminophen (OFIRMEV) IV 1,000 mg  1,000 mg Intravenous To OR Artesha Wemhoff, Aaron Edelman, MD      . ceFAZolin (ANCEF) IVPB 2g/100 mL premix  2 g Intravenous On Call to [place], Aaron Edelman,  MD      . chlorhexidine (HIBICLENS) 4 % liquid 4 application  60 mL Topical Once Michio Thier, Aaron Edelman, MD      . chlorhexidine (HIBICLENS) 4 % liquid 4 application  60 mL Topical Once Cherylin Waguespack, Aaron Edelman, MD      . lactated ringers infusion   Intravenous Continuous Ellender, Karyl Kinnier, MD 50 mL/hr at 04/06/18 1142    . tranexamic acid (CYKLOKAPRON) 1,000 mg in sodium chloride 0.9 % 100 mL IVPB  1,000 mg Intravenous To OR Lilliston, Baltazar Najjar, RPH       No Known Allergies  Social History   Tobacco Use  . Smoking status: Former Smoker    Years: 3.00    Types: Cigarettes    Last attempt to quit: 2000    Years since quitting: 19.3  . Smokeless tobacco: Never Used  Substance Use Topics  . Alcohol use: Not Currently    No family history on file.   Review of Systems  Constitutional: Negative.   HENT: Negative.   Eyes: Negative.   Respiratory: Negative.   Cardiovascular: Negative.   Gastrointestinal: Negative.   Genitourinary: Negative.   Musculoskeletal: Positive for joint pain.  Skin: Negative.   Neurological: Negative.   Endo/Heme/Allergies: Negative.   Psychiatric/Behavioral: Negative.     Objective:  Physical Exam  Vitals reviewed. Constitutional: She is oriented to person, place, and time. She appears well-developed and well-nourished.  HENT:  Head: Normocephalic and atraumatic.  Eyes: Pupils are equal, round, and reactive to light. Conjunctivae and EOM are  normal.  Neck: Normal range of motion.  Cardiovascular: Normal rate, regular rhythm and intact distal pulses.  Respiratory: Effort normal. No respiratory distress.  GI: Soft. She exhibits no distension.  Genitourinary:  Genitourinary Comments: deferred  Musculoskeletal:       Left hip: She exhibits decreased range of motion and bony tenderness.  Neurological: She is alert and oriented to person, place, and time. She has normal reflexes.  Skin: Skin is warm and dry.  Psychiatric: She has a normal mood and affect. Her behavior  is normal. Judgment and thought content normal.    Vital signs in last 24 hours: @VSRANGES @  Labs:   Estimated body mass index is 34.36 kg/m as calculated from the following:   Height as of 04/06/18: 5\' 8"  (1.727 m).   Weight as of 04/06/18: 102.5 kg (226 lb).   Imaging Review Plain radiographs demonstrate severe degenerative joint disease of the left hip(s). The bone quality appears to be adequate for age and reported activity level.    Preoperative templating of the joint replacement has been completed, documented, and submitted to the Operating Room personnel in order to optimize intra-operative equipment management.   Anticipated LOS equal to or greater than 2 midnights due to - Age 35 and older with one or more of the following:  - Obesity  - Expected need for hospital services (PT, OT, Nursing) required for safe  discharge  - Anticipated need for postoperative skilled nursing care or inpatient rehab  - Active co-morbidities: None OR   - Unanticipated findings during/Post Surgery: None  - Patient is a high risk of re-admission due to: None     Assessment/Plan:  End stage arthritis, left hip(s)  The patient history, physical examination, clinical judgement of the provider and imaging studies are consistent with end stage degenerative joint disease of the left hip(s) and total hip arthroplasty is deemed medically necessary. The treatment options including medical management, injection therapy, arthroscopy and arthroplasty were discussed at length. The risks and benefits of total hip arthroplasty were presented and reviewed. The risks due to aseptic loosening, infection, stiffness, dislocation/subluxation,  thromboembolic complications and other imponderables were discussed.  The patient acknowledged the explanation, agreed to proceed with the plan and consent was signed. Patient is being admitted for inpatient treatment for surgery, pain control, PT, OT, prophylactic  antibiotics, VTE prophylaxis, progressive ambulation and ADL's and discharge planning.The patient is planning to be discharged home with HEP. Rx for DME given.

## 2018-03-29 NOTE — Progress Notes (Signed)
PATIENT STATES THAT SHE GAVE HER EKG AND CLEARANCE FORM TO LAURIE FAUST AT DR.SWINTECK OFFICE.   [employer] AND [employer] FOR LAURIE REQUESTING CLEARANCE AND EKG . RN WILL CONTINUE TO F/U

## 2018-03-29 NOTE — H&P (View-Only) (Signed)
TOTAL HIP ADMISSION H&P  Patient is admitted for left total hip arthroplasty.  Subjective:  Chief Complaint: left hip pain  HPI: Margaret Cisneros, 67 y.o. female, has a history of pain and functional disability in the left hip(s) due to arthritis and patient has failed non-surgical conservative treatments for greater than 12 weeks to include NSAID's and/or analgesics, flexibility and strengthening excercises, use of assistive devices, weight reduction as appropriate and activity modification.  Onset of symptoms was gradual starting 2 years ago with gradually worsening course since that time.The patient noted no past surgery on the left hip(s).  Patient currently rates pain in the left hip at 10 out of 10 with activity. Patient has night pain, worsening of pain with activity and weight bearing and pain with passive range of motion. Patient has evidence of subchondral cysts, subchondral sclerosis, periarticular osteophytes and joint space narrowing by imaging studies. This condition presents safety issues increasing the risk of falls. There is no current active infection.  There are no active problems to display for this patient.  Past Medical History:  Diagnosis Date  . Arthritis    right knee arthritis uses celbrex to treat   . Asthma   . GERD (gastroesophageal reflux disease)    severe   . Hypertension   . Pre-diabetes     Past Surgical History:  Procedure Laterality Date  . NO PAST SURGERIES      No current facility-administered medications for this visit.    No current outpatient medications on file.   Facility-Administered Medications Ordered in Other Visits  Medication Dose Route Frequency Provider Last Rate Last Dose  . 0.9 %  sodium chloride infusion   Intravenous Continuous Brantley Wiley, Aaron Edelman, MD      . acetaminophen (OFIRMEV) IV 1,000 mg  1,000 mg Intravenous To OR Yi Haugan, Aaron Edelman, MD      . ceFAZolin (ANCEF) IVPB 2g/100 mL premix  2 g Intravenous On Call to [place], Aaron Edelman,  MD      . chlorhexidine (HIBICLENS) 4 % liquid 4 application  60 mL Topical Once Hephzibah Strehle, Aaron Edelman, MD      . chlorhexidine (HIBICLENS) 4 % liquid 4 application  60 mL Topical Once Barclay Lennox, Aaron Edelman, MD      . lactated ringers infusion   Intravenous Continuous Ellender, Karyl Kinnier, MD 50 mL/hr at 04/06/18 1142    . tranexamic acid (CYKLOKAPRON) 1,000 mg in sodium chloride 0.9 % 100 mL IVPB  1,000 mg Intravenous To OR [place], Baltazar Najjar, RPH       No Known Allergies  Social History   Tobacco Use  . Smoking status: Former Smoker    Years: 3.00    Types: Cigarettes    Last attempt to quit: 2000    Years since quitting: 19.3  . Smokeless tobacco: Never Used  Substance Use Topics  . Alcohol use: Not Currently    No family history on file.   Review of Systems  Constitutional: Negative.   HENT: Negative.   Eyes: Negative.   Respiratory: Negative.   Cardiovascular: Negative.   Gastrointestinal: Negative.   Genitourinary: Negative.   Musculoskeletal: Positive for joint pain.  Skin: Negative.   Neurological: Negative.   Endo/Heme/Allergies: Negative.   Psychiatric/Behavioral: Negative.     Objective:  Physical Exam  Vitals reviewed. Constitutional: She is oriented to person, place, and time. She appears well-developed and well-nourished.  HENT:  Head: Normocephalic and atraumatic.  Eyes: Pupils are equal, round, and reactive to light. Conjunctivae and EOM are  normal.  Neck: Normal range of motion.  Cardiovascular: Normal rate, regular rhythm and intact distal pulses.  Respiratory: Effort normal. No respiratory distress.  GI: Soft. She exhibits no distension.  Genitourinary:  Genitourinary Comments: deferred  Musculoskeletal:       Left hip: She exhibits decreased range of motion and bony tenderness.  Neurological: She is alert and oriented to person, place, and time. She has normal reflexes.  Skin: Skin is warm and dry.  Psychiatric: She has a normal mood and affect. Her behavior  is normal. Judgment and thought content normal.    Vital signs in last 24 hours: @VSRANGES @  Labs:   Estimated body mass index is 34.36 kg/m as calculated from the following:   Height as of 04/06/18: 5\' 8"  (1.727 m).   Weight as of 04/06/18: 102.5 kg (226 lb).   Imaging Review Plain radiographs demonstrate severe degenerative joint disease of the left hip(s). The bone quality appears to be adequate for age and reported activity level.    Preoperative templating of the joint replacement has been completed, documented, and submitted to the Operating Room personnel in order to optimize intra-operative equipment management.   Anticipated LOS equal to or greater than 2 midnights due to - Age 30 and older with one or more of the following:  - Obesity  - Expected need for hospital services (PT, OT, Nursing) required for safe  discharge  - Anticipated need for postoperative skilled nursing care or inpatient rehab  - Active co-morbidities: None OR   - Unanticipated findings during/Post Surgery: None  - Patient is a high risk of re-admission due to: None     Assessment/Plan:  End stage arthritis, left hip(s)  The patient history, physical examination, clinical judgement of the provider and imaging studies are consistent with end stage degenerative joint disease of the left hip(s) and total hip arthroplasty is deemed medically necessary. The treatment options including medical management, injection therapy, arthroscopy and arthroplasty were discussed at length. The risks and benefits of total hip arthroplasty were presented and reviewed. The risks due to aseptic loosening, infection, stiffness, dislocation/subluxation,  thromboembolic complications and other imponderables were discussed.  The patient acknowledged the explanation, agreed to proceed with the plan and consent was signed. Patient is being admitted for inpatient treatment for surgery, pain control, PT, OT, prophylactic  antibiotics, VTE prophylaxis, progressive ambulation and ADL's and discharge planning.The patient is planning to be discharged home with HEP. Rx for DME given.

## 2018-03-30 NOTE — Progress Notes (Signed)
Clearance Dr Jeanie Cooks on chart   EKG 03-16-18 on chart

## 2018-04-05 MED ORDER — TRANEXAMIC ACID 1000 MG/10ML IV SOLN
1000.0000 mg | INTRAVENOUS | Status: AC
  Administered 2018-04-06: 1000 mg via INTRAVENOUS
  Filled 2018-04-05: qty 1100

## 2018-04-06 ENCOUNTER — Inpatient Hospital Stay (HOSPITAL_COMMUNITY)
Admission: RE | Admit: 2018-04-06 | Discharge: 2018-04-08 | DRG: 470 | Disposition: A | Discharge status: Home / Self Care | Payer: Medicare HMO | Source: Ambulatory Visit | Attending: Orthopedic Surgery | Admitting: Orthopedic Surgery

## 2018-04-06 ENCOUNTER — Telehealth (HOSPITAL_COMMUNITY): Payer: Self-pay | Admitting: *Deleted

## 2018-04-06 ENCOUNTER — Encounter (HOSPITAL_COMMUNITY)
Admission: RE | Disposition: A | Discharge status: Home / Self Care | Payer: Self-pay | Source: Ambulatory Visit | Attending: Orthopedic Surgery

## 2018-04-06 ENCOUNTER — Encounter (HOSPITAL_COMMUNITY): Payer: Self-pay | Admitting: Registered Nurse

## 2018-04-06 ENCOUNTER — Inpatient Hospital Stay (HOSPITAL_COMMUNITY): Payer: Medicare HMO | Admitting: Anesthesiology

## 2018-04-06 ENCOUNTER — Inpatient Hospital Stay (HOSPITAL_COMMUNITY): Payer: Medicare HMO

## 2018-04-06 ENCOUNTER — Other Ambulatory Visit: Payer: Self-pay

## 2018-04-06 DIAGNOSIS — Z419 Encounter for procedure for purposes other than remedying health state, unspecified: Secondary | ICD-10-CM

## 2018-04-06 DIAGNOSIS — J45909 Unspecified asthma, uncomplicated: Secondary | ICD-10-CM | POA: Diagnosis present

## 2018-04-06 DIAGNOSIS — R7303 Prediabetes: Secondary | ICD-10-CM | POA: Diagnosis present

## 2018-04-06 DIAGNOSIS — Z7982 Long term (current) use of aspirin: Secondary | ICD-10-CM | POA: Diagnosis not present

## 2018-04-06 DIAGNOSIS — M25552 Pain in left hip: Secondary | ICD-10-CM | POA: Diagnosis present

## 2018-04-06 DIAGNOSIS — I10 Essential (primary) hypertension: Secondary | ICD-10-CM | POA: Diagnosis present

## 2018-04-06 DIAGNOSIS — K219 Gastro-esophageal reflux disease without esophagitis: Secondary | ICD-10-CM | POA: Diagnosis present

## 2018-04-06 DIAGNOSIS — M1612 Unilateral primary osteoarthritis, left hip: Principal | ICD-10-CM | POA: Diagnosis present

## 2018-04-06 DIAGNOSIS — Z09 Encounter for follow-up examination after completed treatment for conditions other than malignant neoplasm: Secondary | ICD-10-CM

## 2018-04-06 DIAGNOSIS — M1711 Unilateral primary osteoarthritis, right knee: Secondary | ICD-10-CM | POA: Diagnosis present

## 2018-04-06 DIAGNOSIS — Z87891 Personal history of nicotine dependence: Secondary | ICD-10-CM | POA: Diagnosis not present

## 2018-04-06 HISTORY — PX: TOTAL HIP ARTHROPLASTY: SHX124

## 2018-04-06 LAB — TYPE AND SCREEN
ABO/RH(D): AB POS
Antibody Screen: NEGATIVE

## 2018-04-06 SURGERY — ARTHROPLASTY, HIP, TOTAL, ANTERIOR APPROACH
Anesthesia: Spinal | Site: Hip | Laterality: Left

## 2018-04-06 MED ORDER — PROPOFOL 10 MG/ML IV BOLUS
INTRAVENOUS | Status: AC
  Filled 2018-04-06: qty 20

## 2018-04-06 MED ORDER — BUPIVACAINE HCL (PF) 0.25 % IJ SOLN
INTRAMUSCULAR | Status: AC
  Filled 2018-04-06: qty 30

## 2018-04-06 MED ORDER — WATER FOR IRRIGATION, STERILE IR SOLN
Status: DC | PRN
  Administered 2018-04-06: 2000 mL

## 2018-04-06 MED ORDER — ISOPROPYL ALCOHOL 70 % SOLN
Status: DC | PRN
  Administered 2018-04-06: 1 via TOPICAL

## 2018-04-06 MED ORDER — POVIDONE-IODINE 10 % EX SWAB
2.0000 "application " | Freq: Once | CUTANEOUS | Status: AC
  Administered 2018-04-06: 2 via TOPICAL

## 2018-04-06 MED ORDER — POLYETHYLENE GLYCOL 3350 17 G PO PACK: 17.0000 g | PACK | Freq: Every day | ORAL | Status: DC | PRN

## 2018-04-06 MED ORDER — ACETAMINOPHEN 10 MG/ML IV SOLN
1000.0000 mg | INTRAVENOUS | Status: AC
  Administered 2018-04-06: 1000 mg via INTRAVENOUS
  Filled 2018-04-06: qty 100

## 2018-04-06 MED ORDER — KETOROLAC TROMETHAMINE 15 MG/ML IJ SOLN
7.5000 mg | Freq: Four times a day (QID) | INTRAMUSCULAR | Status: AC
  Administered 2018-04-06 – 2018-04-07 (×4): 7.5 mg via INTRAVENOUS
  Filled 2018-04-06 (×4): qty 1

## 2018-04-06 MED ORDER — KETOROLAC TROMETHAMINE 30 MG/ML IJ SOLN
INTRAMUSCULAR | Status: AC
  Filled 2018-04-06: qty 1

## 2018-04-06 MED ORDER — ACETAMINOPHEN 325 MG PO TABS: 325.0000 mg | ORAL_TABLET | Freq: Four times a day (QID) | ORAL | Status: DC | PRN

## 2018-04-06 MED ORDER — BUPIVACAINE HCL (PF) 0.25 % IJ SOLN
INTRAMUSCULAR | Status: DC | PRN
  Administered 2018-04-06: 30 mL

## 2018-04-06 MED ORDER — ONDANSETRON HCL 4 MG PO TABS: 4.0000 mg | ORAL_TABLET | Freq: Four times a day (QID) | ORAL | Status: DC | PRN

## 2018-04-06 MED ORDER — FENTANYL CITRATE (PF) 100 MCG/2ML IJ SOLN
INTRAMUSCULAR | Status: AC
  Filled 2018-04-06: qty 2

## 2018-04-06 MED ORDER — ONDANSETRON HCL 4 MG/2ML IJ SOLN
INTRAMUSCULAR | Status: DC | PRN
Start: 2018-04-06 — End: 2018-04-06
  Administered 2018-04-06: 4 mg via INTRAVENOUS

## 2018-04-06 MED ORDER — SODIUM CHLORIDE 0.9 % IJ SOLN
INTRAMUSCULAR | Status: DC | PRN
  Administered 2018-04-06: 30 mL

## 2018-04-06 MED ORDER — PROPOFOL 500 MG/50ML IV EMUL
INTRAVENOUS | Status: DC | PRN
  Administered 2018-04-06: 50 ug/kg/min via INTRAVENOUS

## 2018-04-06 MED ORDER — METHOCARBAMOL 500 MG PO TABS
500.0000 mg | ORAL_TABLET | Freq: Four times a day (QID) | ORAL | Status: DC | PRN
  Administered 2018-04-06 – 2018-04-07 (×3): 500 mg via ORAL
  Filled 2018-04-06 (×3): qty 1

## 2018-04-06 MED ORDER — LACTATED RINGERS IV SOLN
INTRAVENOUS | Status: DC
  Administered 2018-04-06 (×2): via INTRAVENOUS

## 2018-04-06 MED ORDER — DEXAMETHASONE SODIUM PHOSPHATE 10 MG/ML IJ SOLN
INTRAMUSCULAR | Status: AC
  Filled 2018-04-06: qty 1

## 2018-04-06 MED ORDER — METOCLOPRAMIDE HCL 5 MG PO TABS: 5.0000 mg | ORAL_TABLET | Freq: Three times a day (TID) | ORAL | Status: DC | PRN

## 2018-04-06 MED ORDER — PHENYLEPHRINE 40 MCG/ML (10ML) SYRINGE FOR IV PUSH (FOR BLOOD PRESSURE SUPPORT)
PREFILLED_SYRINGE | INTRAVENOUS | Status: AC
  Filled 2018-04-06: qty 10

## 2018-04-06 MED ORDER — LIDOCAINE 2% (20 MG/ML) 5 ML SYRINGE
INTRAMUSCULAR | Status: AC
  Filled 2018-04-06: qty 5

## 2018-04-06 MED ORDER — LIDOCAINE 2% (20 MG/ML) 5 ML SYRINGE
INTRAMUSCULAR | Status: DC | PRN
  Administered 2018-04-06: 40 mg via INTRAVENOUS

## 2018-04-06 MED ORDER — PHENYLEPHRINE 40 MCG/ML (10ML) SYRINGE FOR IV PUSH (FOR BLOOD PRESSURE SUPPORT)
PREFILLED_SYRINGE | INTRAVENOUS | Status: DC | PRN
  Administered 2018-04-06: 80 ug via INTRAVENOUS

## 2018-04-06 MED ORDER — DIPHENHYDRAMINE HCL 12.5 MG/5ML PO ELIX: 12.5000 mg | ORAL_SOLUTION | ORAL | Status: DC | PRN

## 2018-04-06 MED ORDER — ONDANSETRON HCL 4 MG/2ML IJ SOLN: 4.0000 mg | Freq: Four times a day (QID) | INTRAMUSCULAR | Status: DC | PRN

## 2018-04-06 MED ORDER — HYDROMORPHONE HCL 1 MG/ML IJ SOLN
INTRAMUSCULAR | Status: AC
  Filled 2018-04-06: qty 1

## 2018-04-06 MED ORDER — CEFAZOLIN SODIUM-DEXTROSE 2-4 GM/100ML-% IV SOLN
2.0000 g | INTRAVENOUS | Status: AC
  Administered 2018-04-06: 2 g via INTRAVENOUS
  Filled 2018-04-06: qty 100

## 2018-04-06 MED ORDER — METOCLOPRAMIDE HCL 5 MG/ML IJ SOLN: 5.0000 mg | Freq: Three times a day (TID) | INTRAMUSCULAR | Status: DC | PRN

## 2018-04-06 MED ORDER — SODIUM CHLORIDE 0.9 % IR SOLN
Status: DC | PRN
  Administered 2018-04-06: 1000 mL

## 2018-04-06 MED ORDER — CHLORHEXIDINE GLUCONATE 4 % EX LIQD: 60.0000 mL | Freq: Once | CUTANEOUS | Status: DC

## 2018-04-06 MED ORDER — SODIUM CHLORIDE 0.9 % IV SOLN: INTRAVENOUS | Status: DC

## 2018-04-06 MED ORDER — HYDROCHLOROTHIAZIDE 12.5 MG PO CAPS
12.5000 mg | ORAL_CAPSULE | Freq: Every day | ORAL | Status: DC
  Administered 2018-04-07 – 2018-04-08 (×2): 12.5 mg via ORAL
  Filled 2018-04-06 (×2): qty 1

## 2018-04-06 MED ORDER — ALUM & MAG HYDROXIDE-SIMETH 200-200-20 MG/5ML PO SUSP
30.0000 mL | ORAL | Status: DC | PRN
Start: 2018-04-06 — End: 2018-04-08

## 2018-04-06 MED ORDER — SODIUM CHLORIDE 0.9 % IV SOLN
INTRAVENOUS | Status: DC
  Administered 2018-04-06 – 2018-04-07 (×2): via INTRAVENOUS

## 2018-04-06 MED ORDER — MORPHINE SULFATE (PF) 2 MG/ML IV SOLN: 0.5000 mg | INTRAVENOUS | Status: DC | PRN

## 2018-04-06 MED ORDER — PANTOPRAZOLE SODIUM 40 MG PO TBEC
80.0000 mg | DELAYED_RELEASE_TABLET | Freq: Every day | ORAL | Status: DC
  Administered 2018-04-07 – 2018-04-08 (×2): 80 mg via ORAL
  Filled 2018-04-06 (×2): qty 2

## 2018-04-06 MED ORDER — DEXAMETHASONE SODIUM PHOSPHATE 10 MG/ML IJ SOLN
10.0000 mg | Freq: Once | INTRAMUSCULAR | Status: AC
  Administered 2018-04-07: 10 mg via INTRAVENOUS
  Filled 2018-04-06: qty 1

## 2018-04-06 MED ORDER — SODIUM CHLORIDE 0.9 % IJ SOLN
INTRAMUSCULAR | Status: AC
  Filled 2018-04-06: qty 50

## 2018-04-06 MED ORDER — METHOCARBAMOL 1000 MG/10ML IJ SOLN
500.0000 mg | Freq: Four times a day (QID) | INTRAVENOUS | Status: DC | PRN
  Administered 2018-04-06: 500 mg via INTRAVENOUS
  Filled 2018-04-06: qty 550

## 2018-04-06 MED ORDER — ONDANSETRON HCL 4 MG/2ML IJ SOLN
INTRAMUSCULAR | Status: AC
  Filled 2018-04-06: qty 2

## 2018-04-06 MED ORDER — MIDAZOLAM HCL 5 MG/5ML IJ SOLN
INTRAMUSCULAR | Status: DC | PRN
  Administered 2018-04-06: 2 mg via INTRAVENOUS

## 2018-04-06 MED ORDER — MENTHOL 3 MG MT LOZG: 1.0000 | LOZENGE | OROMUCOSAL | Status: DC | PRN

## 2018-04-06 MED ORDER — DOCUSATE SODIUM 100 MG PO CAPS
100.0000 mg | ORAL_CAPSULE | Freq: Two times a day (BID) | ORAL | Status: DC
  Administered 2018-04-06 – 2018-04-08 (×4): 100 mg via ORAL
  Filled 2018-04-06 (×4): qty 1

## 2018-04-06 MED ORDER — MIDAZOLAM HCL 2 MG/2ML IJ SOLN
INTRAMUSCULAR | Status: AC
  Filled 2018-04-06: qty 2

## 2018-04-06 MED ORDER — DEXAMETHASONE SODIUM PHOSPHATE 10 MG/ML IJ SOLN
INTRAMUSCULAR | Status: DC | PRN
  Administered 2018-04-06: 10 mg via INTRAVENOUS

## 2018-04-06 MED ORDER — PHENOL 1.4 % MT LIQD: 1.0000 | OROMUCOSAL | Status: DC | PRN

## 2018-04-06 MED ORDER — HYDROCODONE-ACETAMINOPHEN 5-325 MG PO TABS
1.0000 | ORAL_TABLET | ORAL | Status: DC | PRN
  Administered 2018-04-06 – 2018-04-08 (×6): 2 via ORAL
  Filled 2018-04-06 (×6): qty 2

## 2018-04-06 MED ORDER — ASPIRIN 81 MG PO CHEW
81.0000 mg | CHEWABLE_TABLET | Freq: Two times a day (BID) | ORAL | Status: DC
  Administered 2018-04-07 – 2018-04-08 (×3): 81 mg via ORAL
  Filled 2018-04-06 (×3): qty 1

## 2018-04-06 MED ORDER — CEFAZOLIN SODIUM-DEXTROSE 2-4 GM/100ML-% IV SOLN
2.0000 g | Freq: Four times a day (QID) | INTRAVENOUS | Status: AC
  Administered 2018-04-06 – 2018-04-07 (×2): 2 g via INTRAVENOUS
  Filled 2018-04-06 (×2): qty 100

## 2018-04-06 MED ORDER — FENTANYL CITRATE (PF) 100 MCG/2ML IJ SOLN
INTRAMUSCULAR | Status: DC | PRN
  Administered 2018-04-06 (×2): 50 ug via INTRAVENOUS

## 2018-04-06 MED ORDER — PROPOFOL 10 MG/ML IV BOLUS
INTRAVENOUS | Status: AC
  Filled 2018-04-06: qty 60

## 2018-04-06 MED ORDER — ALBUTEROL SULFATE (2.5 MG/3ML) 0.083% IN NEBU: 2.5000 mg | INHALATION_SOLUTION | Freq: Four times a day (QID) | RESPIRATORY_TRACT | Status: DC | PRN

## 2018-04-06 MED ORDER — HYDROMORPHONE HCL 1 MG/ML IJ SOLN
0.2500 mg | INTRAMUSCULAR | Status: DC | PRN
  Administered 2018-04-06 (×2): 0.5 mg via INTRAVENOUS

## 2018-04-06 MED ORDER — BUPIVACAINE IN DEXTROSE 0.75-8.25 % IT SOLN
INTRATHECAL | Status: DC | PRN
  Administered 2018-04-06: 2 mL via INTRATHECAL

## 2018-04-06 MED ORDER — KETOROLAC TROMETHAMINE 30 MG/ML IJ SOLN
INTRAMUSCULAR | Status: DC | PRN
  Administered 2018-04-06: 30 mg

## 2018-04-06 SURGICAL SUPPLY — 61 items
BAG DECANTER FOR FLEXI CONT (MISCELLANEOUS) IMPLANT
BAG ZIPLOCK 12X15 (MISCELLANEOUS) IMPLANT
CAPT HIP TOTAL 2 ×3 IMPLANT
CHLORAPREP W/TINT 26ML (MISCELLANEOUS) ×6 IMPLANT
CLOTH BEACON ORANGE TIMEOUT ST (SAFETY) ×3 IMPLANT
COVER PERINEAL POST (MISCELLANEOUS) ×3 IMPLANT
COVER SURGICAL LIGHT HANDLE (MISCELLANEOUS) ×3 IMPLANT
DECANTER SPIKE VIAL GLASS SM (MISCELLANEOUS) ×3 IMPLANT
DERMABOND ADVANCED (GAUZE/BANDAGES/DRESSINGS) ×2
DERMABOND ADVANCED .7 DNX12 (GAUZE/BANDAGES/DRESSINGS) ×1 IMPLANT
DRAPE SHEET LG 3/4 BI-LAMINATE (DRAPES) ×9 IMPLANT
DRAPE STERI IOBAN 125X83 (DRAPES) ×3 IMPLANT
DRAPE U-SHAPE 47X51 STRL (DRAPES) ×6 IMPLANT
DRSG AQUACEL AG ADV 3.5X10 (GAUZE/BANDAGES/DRESSINGS) ×3 IMPLANT
ELECT PENCIL ROCKER SW 15FT (MISCELLANEOUS) ×3 IMPLANT
ELECT REM PT RETURN 15FT ADLT (MISCELLANEOUS) ×3 IMPLANT
FACESHIELD WRAPAROUND (MASK) ×6 IMPLANT
GAUZE SPONGE 4X4 12PLY STRL (GAUZE/BANDAGES/DRESSINGS) ×3 IMPLANT
GLOVE BIO SURGEON STRL SZ8.5 (GLOVE) ×6 IMPLANT
GLOVE BIOGEL M STRL SZ7.5 (GLOVE) ×3 IMPLANT
GLOVE BIOGEL PI IND STRL 7.0 (GLOVE) ×2 IMPLANT
GLOVE BIOGEL PI IND STRL 7.5 (GLOVE) ×1 IMPLANT
GLOVE BIOGEL PI IND STRL 8 (GLOVE) ×1 IMPLANT
GLOVE BIOGEL PI IND STRL 8.5 (GLOVE) ×1 IMPLANT
GLOVE BIOGEL PI IND STRL 9 (GLOVE) ×1 IMPLANT
GLOVE BIOGEL PI INDICATOR 7.0 (GLOVE) ×4
GLOVE BIOGEL PI INDICATOR 7.5 (GLOVE) ×2
GLOVE BIOGEL PI INDICATOR 8 (GLOVE) ×2
GLOVE BIOGEL PI INDICATOR 8.5 (GLOVE) ×2
GLOVE BIOGEL PI INDICATOR 9 (GLOVE) ×2
GLOVE ECLIPSE 8.5 STRL (GLOVE) ×6 IMPLANT
GLOVE SURG SS PI 7.5 STRL IVOR (GLOVE) ×3 IMPLANT
GLOVE SURG SS PI 8.0 STRL IVOR (GLOVE) ×3 IMPLANT
GOWN SPEC L3 XXLG W/TWL (GOWN DISPOSABLE) ×9 IMPLANT
GOWN SPEC L4 XLG W/TWL (GOWN DISPOSABLE) ×3 IMPLANT
GOWN STRL REIN 3XL XLG LVL4 (GOWN DISPOSABLE) ×3 IMPLANT
HANDPIECE INTERPULSE COAX TIP (DISPOSABLE) ×2
HOLDER FOLEY CATH W/STRAP (MISCELLANEOUS) ×3 IMPLANT
HOOD PEEL AWAY FLYTE STAYCOOL (MISCELLANEOUS) ×6 IMPLANT
MARKER SKIN DUAL TIP RULER LAB (MISCELLANEOUS) ×3 IMPLANT
NDL SAFETY ECLIPSE 18X1.5 (NEEDLE) ×1 IMPLANT
NEEDLE HYPO 18GX1.5 SHARP (NEEDLE) ×2
NEEDLE SPNL 18GX3.5 QUINCKE PK (NEEDLE) ×3 IMPLANT
PACK ANTERIOR HIP CUSTOM (KITS) ×3 IMPLANT
SAW OSC TIP CART 19.5X105X1.3 (SAW) ×3 IMPLANT
SEALER BIPOLAR AQUA 6.0 (INSTRUMENTS) ×3 IMPLANT
SET HNDPC FAN SPRY TIP SCT (DISPOSABLE) ×1 IMPLANT
SUT ETHIBOND NAB CT1 #1 30IN (SUTURE) ×6 IMPLANT
SUT MNCRL AB 3-0 PS2 18 (SUTURE) ×3 IMPLANT
SUT MON AB 2-0 CT1 36 (SUTURE) ×6 IMPLANT
SUT STRATAFIX PDO 1 14 VIOLET (SUTURE) ×2
SUT STRATFX PDO 1 14 VIOLET (SUTURE) ×1
SUT VIC AB 2-0 CT1 27 (SUTURE) ×2
SUT VIC AB 2-0 CT1 TAPERPNT 27 (SUTURE) ×1 IMPLANT
SUTURE STRATFX PDO 1 14 VIOLET (SUTURE) ×1 IMPLANT
SYR 3ML LL SCALE MARK (SYRINGE) ×3 IMPLANT
SYR 50ML LL SCALE MARK (SYRINGE) ×3 IMPLANT
TRAY FOLEY CATH 14FRSI W/METER (CATHETERS) ×3 IMPLANT
TRAY FOLEY W/METER SILVER 16FR (SET/KITS/TRAYS/PACK) IMPLANT
WATER STERILE IRR 1000ML POUR (IV SOLUTION) ×3 IMPLANT
YANKAUER SUCT BULB TIP 10FT TU (MISCELLANEOUS) ×3 IMPLANT

## 2018-04-06 NOTE — Anesthesia Procedure Notes (Signed)
Spinal  Patient location during procedure: OR Start time: 04/06/2018 12:17 PM End time: 04/06/2018 12:24 PM Staffing Anesthesiologist: Suzette Battiest, MD Performed: anesthesiologist  Preanesthetic Checklist Completed: patient identified, site marked, surgical consent, pre-op evaluation, timeout performed, IV checked, risks and benefits discussed and monitors and equipment checked Spinal Block Patient position: sitting Prep: site prepped and draped and DuraPrep Patient monitoring: blood pressure, continuous pulse ox and heart rate Approach: midline Location: L4-5 Injection technique: single-shot Needle Needle type: Pencan  Needle gauge: 24 G Needle length: 9 cm

## 2018-04-06 NOTE — Anesthesia Postprocedure Evaluation (Signed)
Anesthesia Post Note  Patient: Margaret Cisneros  Procedure(s) Performed: LEFT TOTAL HIP ARTHROPLASTY ANTERIOR APPROACH (Left Hip)     Patient location during evaluation: PACU Anesthesia Type: Spinal and MAC Level of consciousness: awake and alert Pain management: pain level controlled Vital Signs Assessment: post-procedure vital signs reviewed and stable Respiratory status: spontaneous breathing and respiratory function stable Cardiovascular status: blood pressure returned to baseline and stable Postop Assessment: spinal receding Anesthetic complications: no    Last Vitals:  Vitals:   04/06/18 1545 04/06/18 1615  BP: 132/85 133/82  Pulse: 78 65  Resp: 18 15  Temp: 36.7 C 36.8 C  SpO2: 100% 100%    Last Pain:  Vitals:   04/06/18 1545  TempSrc:   PainSc: 3                  Tiajuana Amass

## 2018-04-06 NOTE — Interval H&P Note (Signed)
History and Physical Interval Note:  04/06/2018 11:56 AM  Margaret Cisneros  has presented today for surgery, with the diagnosis of Degenerative joint disease left hip  The various methods of treatment have been discussed with the patient and family. After consideration of risks, benefits and other options for treatment, the patient has consented to  Procedure(s) with comments: LEFT TOTAL HIP ARTHROPLASTY ANTERIOR APPROACH (Left) - Needs RNFA as a surgical intervention .  The patient's history has been reviewed, patient examined, no change in status, stable for surgery.  I have reviewed the patient's chart and labs.  Questions were answered to the patient's satisfaction.     Hilton Cork Evelyn Aguinaldo

## 2018-04-06 NOTE — Op Note (Signed)
OPERATIVE REPORT  SURGEON: Rod Can, MD   ASSISTANT: Nehemiah Massed, PA-C.  PREOPERATIVE DIAGNOSIS: Left hip arthritis.   POSTOPERATIVE DIAGNOSIS: Left hip arthritis.   PROCEDURE: Left total hip arthroplasty, anterior approach.   IMPLANTS: DePuy Tri Lock stem, size 3, hi offset. DePuy Pinnacle Cup, size 54 mm. DePuy Altrx liner, size 36 by 54 mm, neutral. DePuy Biolox ceramic head ball, size 36 + 1.5 mm.  ANESTHESIA:  Spinal  ESTIMATED BLOOD LOSS: 350 mL.  ANTIBIOTICS: 2 g Ancef.  DRAINS: None.  COMPLICATIONS: None.   CONDITION: PACU - hemodynamically stable.   BRIEF CLINICAL NOTE: Margaret Cisneros is a 67 y.o. female with a long-standing history of Left hip arthritis. After failing conservative management, the patient was indicated for total hip arthroplasty. The risks, benefits, and alternatives to the procedure were explained, and the patient elected to proceed.  PROCEDURE IN DETAIL: Surgical site was marked by myself in the pre-op holding area. Once inside the operating room, spinal anesthesia was obtained, and a foley catheter was inserted. The patient was then positioned on the Hana table. All bony prominences were well padded. The hip was prepped and draped in the normal sterile surgical fashion. A time-out was called verifying side and site of surgery. The patient received IV antibiotics within 60 minutes of beginning the procedure.  The direct anterior approach to the hip was performed through the Hueter interval. Lateral femoral circumflex vessels were treated with the Auqumantys. The anterior capsule was exposed and an inverted T capsulotomy was made.The femoral neck cut was made to the level of the templated cut. A corkscrew was placed into the head and the head was removed. The femoral head was found to have eburnated bone. The head was passed to the back table and was measured.  Acetabular exposure was achieved, and the pulvinar and labrum were  excised. Sequential reaming of the acetabulum was then performed up to a size 53 mm reamer. A 54 mm cup was then opened and impacted into place at approximately 40 degrees of abduction and 20 degrees of anteversion. The final polyethylene liner was impacted into place and acetabular osteophytes were removed.   I then gained femoral exposure taking care to protect the abductors and greater trochanter. This was performed using standard external rotation, extension, and adduction. The capsule was peeled off the inner aspect of the greater trochanter, taking care to preserve the short external rotators. A cookie cutter was used to enter the femoral canal, and then the femoral canal finder was placed. Sequential broaching was performed up to a size 3. Calcar planer was used on the femoral neck remnant. I placed a hi offset neck and a trial head ball. The hip was reduced. Leg lengths and offset were checked fluoroscopically. The hip was dislocated and trial components were removed. The final implants were placed, and the hip was reduced.  Fluoroscopy was used to confirm component position and leg lengths. At 90 degrees of external rotation and full extension, the hip was stable to an anterior directed force.  The wound was copiously irrigated with normal saline using pulse lavage. Marcaine solution was injected into the periarticular soft tissue. The wound was closed in layers using #1 Vicryl and V-Loc for the fascia, 2-0 Vicryl for the subcutaneous fat, 2-0 Monocryl for the deep dermal layer, 3-0 running Monocryl subcuticular stitch, and Dermabond for the skin. Once the glue was fully dried, an Aquacell Ag dressing was applied. The patient was transported to the recovery room in  stable condition. Sponge, needle, and instrument counts were correct at the end of the case x2. The patient tolerated the procedure well and there were no known complications.  Please note that a surgical assistant was a  medical necessity for this procedure to perform it in a safe and expeditious manner. Assistant was necessary to provide appropriate retraction of vital neurovascular structures, to prevent femoral fracture, and to allow for anatomic placement of the prosthesis.

## 2018-04-06 NOTE — Transfer of Care (Signed)
Immediate Anesthesia Transfer of Care Note  Patient: Margaret Cisneros  Procedure(s) Performed: LEFT TOTAL HIP ARTHROPLASTY ANTERIOR APPROACH (Left Hip)  Patient Location: PACU  Anesthesia Type:Spinal  Level of Consciousness: sedated  Airway & Oxygen Therapy: Patient Spontanous Breathing and Patient connected to face mask oxygen  Post-op Assessment: Report given to RN and Post -op Vital signs reviewed and stable  Post vital signs: Reviewed and stable  Last Vitals:  Vitals Value Taken Time  BP 153/103 04/06/2018  2:56 PM  Temp    Pulse 73 04/06/2018  2:58 PM  Resp 27 04/06/2018  2:58 PM  SpO2 100 % 04/06/2018  2:58 PM  Vitals shown include unvalidated device data.  Last Pain:  Vitals:   04/06/18 1150  TempSrc:   PainSc: 0-No pain      Patients Stated Pain Goal: 4 (48/18/56 3149)  Complications: No apparent anesthesia complications

## 2018-04-06 NOTE — Anesthesia Preprocedure Evaluation (Addendum)
Anesthesia Evaluation  Patient identified by MRN, date of birth, ID band Patient awake    Reviewed: Allergy & Precautions, NPO status , Patient's Chart, lab work & pertinent test results  Airway Mallampati: II  TM Distance: >3 FB Neck ROM: Full    Dental  (+) Dental Advisory Given   Pulmonary asthma , former smoker,    breath sounds clear to auscultation       Cardiovascular hypertension, Pt. on medications  Rhythm:Regular Rate:Normal     Neuro/Psych negative neurological ROS     GI/Hepatic Neg liver ROS, GERD  ,  Endo/Other  negative endocrine ROS  Renal/GU negative Renal ROS     Musculoskeletal  (+) Arthritis ,   Abdominal   Peds  Hematology negative hematology ROS (+)   Anesthesia Other Findings   Reproductive/Obstetrics                            Lab Results  Component Value Date   WBC 7.6 03/29/2018   HGB 14.0 03/29/2018   HCT 42.8 03/29/2018   MCV 84.3 03/29/2018   PLT 216 03/29/2018   Lab Results  Component Value Date   CREATININE 0.76 03/29/2018   BUN 12 03/29/2018   NA 139 03/29/2018   K 3.6 03/29/2018   CL 106 03/29/2018   CO2 23 03/29/2018    Anesthesia Physical Anesthesia Plan  ASA: II  Anesthesia Plan: Spinal and MAC   Post-op Pain Management:  Regional for Post-op pain   Induction: Intravenous  PONV Risk Score and Plan: 2 and Propofol infusion, Ondansetron and Treatment may vary due to age or medical condition  Airway Management Planned: Natural Airway and Simple Face Mask  Additional Equipment:   Intra-op Plan:   Post-operative Plan:   Informed Consent: I have reviewed the patients History and Physical, chart, labs and discussed the procedure including the risks, benefits and alternatives for the proposed anesthesia with the patient or authorized representative who has indicated his/her understanding and acceptance.     Plan Discussed with:  CRNA  Anesthesia Plan Comments:        Anesthesia Quick Evaluation

## 2018-04-06 NOTE — Progress Notes (Signed)
On arrival to PACU, pt spit out small piece of tooth material; notified [facility] CRNA and Dr. Ola Spurr, anesthesiologist; left upper tooth is missing small piece

## 2018-04-06 NOTE — Progress Notes (Signed)
Dr. Ola Spurr notified of EKG taken today.

## 2018-04-06 NOTE — Discharge Instructions (Signed)
°Dr. Roseland Braun °Joint Replacement Specialist °[facility] °[address] °[place], [place] [address] °[phone] ° ° °TOTAL HIP REPLACEMENT POSTOPERATIVE DIRECTIONS ° ° ° °Hip Rehabilitation, Guidelines Following Surgery  ° °WEIGHT BEARING °Weight bearing as tolerated with assist device (walker, cane, etc) as directed, use it as long as suggested by your surgeon or therapist, typically at least 4-6 weeks. ° °The results of a hip operation are greatly improved after range of motion and muscle strengthening exercises. Follow all safety measures which are given to protect your hip. If any of these exercises cause increased pain or swelling in your joint, decrease the amount until you are comfortable again. Then slowly increase the exercises. Call your caregiver if you have problems or questions.  ° °HOME CARE INSTRUCTIONS  °Most of the following instructions are designed to prevent the dislocation of your new hip.  °Remove items at home which could result in a fall. This includes throw rugs or furniture in walking pathways.  °Continue medications as instructed at time of discharge. °· You may have some home medications which will be placed on hold until you complete the course of blood thinner medication. °· You may start showering once you are discharged home. Do not remove your dressing. °Do not put on socks or shoes without following the instructions of your caregivers.   °Sit on chairs with arms. Use the chair arms to help push yourself up when arising.  °Arrange for the use of a toilet seat elevator so you are not sitting low.  °· Walk with walker as instructed.  °You may resume a sexual relationship in one month or when given the OK by your caregiver.  °Use walker as long as suggested by your caregivers.  °You may put full weight on your legs and walk as much as is comfortable. °Avoid periods of inactivity such as sitting longer than an hour when not asleep. This helps prevent  blood clots.  °You may return to work once you are cleared by your surgeon.  °Do not drive a car for 6 weeks or until released by your surgeon.  °Do not drive while taking narcotics.  °Wear elastic stockings for two weeks following surgery during the day but you may remove then at night.  °Make sure you keep all of your appointments after your operation with all of your doctors and caregivers. You should call the office at the above phone number and make an appointment for approximately two weeks after the date of your surgery. °Please pick up a stool softener and laxative for home use as long as you are requiring pain medications. °· ICE to the affected hip every three hours for 30 minutes at a time and then as needed for pain and swelling. Continue to use ice on the hip for pain and swelling from surgery. You may notice swelling that will progress down to the foot and ankle.  This is normal after surgery.  Elevate the leg when you are not up walking on it.   °It is important for you to complete the blood thinner medication as prescribed by your doctor. °· Continue to use the breathing machine which will help keep your temperature down.  It is common for your temperature to cycle up and down following surgery, especially at night when you are not up moving around and exerting yourself.  The breathing machine keeps your lungs expanded and your temperature down. ° °RANGE OF MOTION AND STRENGTHENING EXERCISES  °These exercises are   designed to help you keep full movement of your hip joint. Follow your caregiver's or physical therapist's instructions. Perform all exercises about fifteen times, three times per day or as directed. Exercise both hips, even if you have had only one joint replacement. These exercises can be done on a training (exercise) mat, on the floor, on a table or on a bed. Use whatever works the best and is most comfortable for you. Use music or television while you are exercising so that the exercises  are a pleasant break in your day. This will make your life better with the exercises acting as a break in routine you can look forward to.  °Lying on your back, slowly slide your foot toward your buttocks, raising your knee up off the floor. Then slowly slide your foot back down until your leg is straight again.  °Lying on your back spread your legs as far apart as you can without causing discomfort.  °Lying on your side, raise your upper leg and foot straight up from the floor as far as is comfortable. Slowly lower the leg and repeat.  °Lying on your back, tighten up the muscle in the front of your thigh (quadriceps muscles). You can do this by keeping your leg straight and trying to raise your heel off the floor. This helps strengthen the largest muscle supporting your knee.  °Lying on your back, tighten up the muscles of your buttocks both with the legs straight and with the knee bent at a comfortable angle while keeping your heel on the floor.  ° °SKILLED REHAB INSTRUCTIONS: °If the patient is transferred to a skilled rehab facility following release from the hospital, a list of the current medications will be sent to the facility for the patient to continue.  When discharged from the skilled rehab facility, please have the facility set up the patient's Home Health Physical Therapy prior to being released. Also, the skilled facility will be responsible for providing the patient with their medications at time of release from the facility to include their pain medication and their blood thinner medication. If the patient is still at the rehab facility at time of the two week follow up appointment, the skilled rehab facility will also need to assist the patient in arranging follow up appointment in our office and any transportation needs. ° °MAKE SURE YOU:  °Understand these instructions.  °Will watch your condition.  °Will get help right away if you are not doing well or get worse. ° °Pick up stool softner and  laxative for home use following surgery while on pain medications. °Do not remove your dressing. °The dressing is waterproof--it is OK to take showers. °Continue to use ice for pain and swelling after surgery. °Do not use any lotions or creams on the incision until instructed by your surgeon. °Total Hip Protocol. ° ° °

## 2018-04-06 NOTE — Anesthesia Procedure Notes (Signed)
Date/Time: 04/06/2018 12:30 PM Performed by: Talbot Grumbling, CRNA Oxygen Delivery Method: Simple face mask

## 2018-04-07 ENCOUNTER — Encounter (HOSPITAL_COMMUNITY): Payer: Self-pay | Admitting: Orthopedic Surgery

## 2018-04-07 LAB — CBC
HEMATOCRIT: 38 % (ref 36.0–46.0)
HEMOGLOBIN: 12.2 g/dL (ref 12.0–15.0)
MCH: 27.3 pg (ref 26.0–34.0)
MCHC: 32.1 g/dL (ref 30.0–36.0)
MCV: 85 fL (ref 78.0–100.0)
PLATELETS: 204 10*3/uL (ref 150–400)
RBC: 4.47 MIL/uL (ref 3.87–5.11)
RDW: 15.5 % (ref 11.5–15.5)
WBC: 12.9 10*3/uL — AB (ref 4.0–10.5)

## 2018-04-07 LAB — BASIC METABOLIC PANEL
ANION GAP: 7 (ref 5–15)
BUN: 13 mg/dL (ref 6–20)
CHLORIDE: 105 mmol/L (ref 101–111)
CO2: 24 mmol/L (ref 22–32)
Calcium: 8.4 mg/dL — ABNORMAL LOW (ref 8.9–10.3)
Creatinine, Ser: 0.74 mg/dL (ref 0.44–1.00)
GFR calc Af Amer: >60 mL/min (ref >60)
GLUCOSE: 138 mg/dL — AB (ref 65–99)
POTASSIUM: 4 mmol/L (ref 3.5–5.1)
Sodium: 136 mmol/L (ref 135–145)

## 2018-04-07 MED ORDER — SENNA 8.6 MG PO TABS: 2.0000 | ORAL_TABLET | Freq: Every day | ORAL | 3 refills | Status: DC

## 2018-04-07 MED ORDER — HYDROCODONE-ACETAMINOPHEN 5-325 MG PO TABS: 1.0000 | ORAL_TABLET | Freq: Four times a day (QID) | ORAL | 0 refills | Status: DC | PRN

## 2018-04-07 MED ORDER — ONDANSETRON HCL 4 MG PO TABS: 4.0000 mg | ORAL_TABLET | Freq: Four times a day (QID) | ORAL | 0 refills | Status: DC | PRN

## 2018-04-07 MED ORDER — ASPIRIN 81 MG PO CHEW: 81.0000 mg | CHEWABLE_TABLET | Freq: Two times a day (BID) | ORAL | 1 refills | Status: DC

## 2018-04-07 MED ORDER — DOCUSATE SODIUM 100 MG PO CAPS: 100.0000 mg | ORAL_CAPSULE | Freq: Two times a day (BID) | ORAL | 1 refills | Status: DC

## 2018-04-07 NOTE — Progress Notes (Signed)
Physical Therapy Treatment Patient Details Name: Margaret Cisneros MRN: [id number] DOB: 1951-02-02 Today's Date: 04/07/2018    History of Present Illness Pt s/p L THR    PT Comments    Pt very motivated, progressing well with mobility and hopeful for dc home tomorrow.   Follow Up Recommendations  Follow surgeon's recommendation for DC plan and follow-up therapies     Equipment Recommendations  None recommended by PT    Recommendations for Other Services OT consult     Precautions / Restrictions Precautions Precautions: Fall Restrictions Weight Bearing Restrictions: No Other Position/Activity Restrictions: WBAT    Mobility  Bed Mobility Overal bed mobility: Needs Assistance Bed Mobility: Sit to Supine     Supine to sit: Min assist Sit to supine: Min guard   General bed mobility comments: cues for sequence and use of R LE to self assist  Transfers Overall transfer level: Needs assistance Equipment used: Rolling walker (2 wheeled) Transfers: Sit to/from Stand Sit to Stand: Min guard         General transfer comment: cues for LE management and use of UEs to self assist  Ambulation/Gait Ambulation/Gait assistance: Min guard;Supervision Ambulation Distance (Feet): 190 Feet Assistive device: Rolling walker (2 wheeled) Gait Pattern/deviations: Step-to pattern;Step-through pattern;Decreased step length - right;Decreased step length - left;Shuffle;Trunk flexed Gait velocity: decr   General Gait Details: cues for sequence, posture and position from [employer]             Wheelchair Mobility    Modified Rankin (Stroke Patients Only)       Balance                                            Cognition Arousal/Alertness: Awake/alert Behavior During Therapy: WFL for tasks assessed/performed Overall Cognitive Status: Within Functional Limits for tasks assessed                                        Exercises Total  Joint Exercises Ankle Circles/Pumps: AROM;Both;15 reps;Supine Quad Sets: AROM;Both;10 reps;Supine Heel Slides: AAROM;Left;20 reps;Supine Hip ABduction/ADduction: AAROM;Left;15 reps;Supine    General Comments        Pertinent Vitals/Pain Pain Assessment: 0-10 Pain Score: 3  Pain Location: L hip Pain Descriptors / Indicators: Aching;Burning Pain Intervention(s): Premedicated before session;Monitored during session;Limited activity within patient's tolerance;Ice applied    Home Living Family/patient expects to be discharged to:: Private residence Living Arrangements: Other relatives Available Help at Discharge: Family Type of Home: House Home Access: Stairs to enter Entrance Stairs-Rails: Right Home Layout: One level Home Equipment: Environmental consultant - 2 wheels;Cane - single point;Bedside commode      Prior Function Level of Independence: Independent          PT Goals (current goals can now be found in the care plan section) Acute Rehab PT Goals Patient Stated Goal: Regain IND  PT Goal Formulation: With patient Time For Goal Achievement: 04/14/18 Potential to Achieve Goals: Good Progress towards PT goals: Progressing toward goals    Frequency    7X/week      PT Plan Current plan remains appropriate    Co-evaluation              AM-PAC PT "6 Clicks" Daily Activity  Outcome Measure  Difficulty turning over in  bed (including adjusting bedclothes, sheets and blankets)?: A Lot Difficulty moving from lying on back to sitting on the side of the bed? : A Lot Difficulty sitting down on and standing up from a chair with arms (e.g., wheelchair, bedside commode, etc,.)?: A Lot Help needed moving to and from a bed to chair (including a wheelchair)?: A Little Help needed walking in hospital room?: A Little Help needed climbing 3-5 steps with a railing? : A Little 6 Click Score: 15    End of Session Equipment Utilized During Treatment: Gait belt Activity Tolerance: Patient  tolerated treatment well Patient left: with call bell/phone within reach;in bed Nurse Communication: Mobility status PT Visit Diagnosis: Difficulty in walking, not elsewhere classified (R26.2)     Time: 2549-8264 PT Time Calculation (min) (ACUTE ONLY): 23 min  Charges:  $Gait Training: 23-37 mins $Therapeutic Exercise: 8-22 mins                    G Codes:       Pg [phone]    Jahid Weida 04/07/2018, 3:38 PM

## 2018-04-07 NOTE — Progress Notes (Signed)
Spoke with patient at bedside. Confirmed plan for HEP. Has RW but needs a 3n1, contacted [facility] to deliver to room. [phone]

## 2018-04-07 NOTE — Evaluation (Signed)
Physical Therapy Evaluation Patient Details Name: Margaret Cisneros MRN: [id number] DOB: 18-Apr-1951 Today's Date: 04/07/2018   History of Present Illness  Pt s/p L THR  Clinical Impression  Pt s/p L THR and presents with decreased L LE strength/ROM, post op pain and obesity limiting functional mobility.  Pt plans dc home with intermittent assist of family.    Follow Up Recommendations Follow surgeon's recommendation for DC plan and follow-up therapies    Equipment Recommendations  None recommended by PT    Recommendations for Other Services OT consult     Precautions / Restrictions Precautions Precautions: Fall Restrictions Weight Bearing Restrictions: No Other Position/Activity Restrictions: WBAT      Mobility  Bed Mobility Overal bed mobility: Needs Assistance Bed Mobility: Supine to Sit     Supine to sit: Min assist     General bed mobility comments: cues for sequence and use of R LE to self assist  Transfers Overall transfer level: Needs assistance Equipment used: Rolling walker (2 wheeled) Transfers: Sit to/from Stand Sit to Stand: Min assist         General transfer comment: cues for LE management and use of UEs to self assist  Ambulation/Gait Ambulation/Gait assistance: Min assist Ambulation Distance (Feet): 75 Feet Assistive device: Rolling walker (2 wheeled) Gait Pattern/deviations: Step-to pattern;Step-through pattern;Decreased step length - right;Decreased step length - left;Shuffle;Trunk flexed Gait velocity: decr   General Gait Details: cues for sequence, posture and position from [employer]            Wheelchair Mobility    Modified Rankin (Stroke Patients Only)       Balance                                             Pertinent Vitals/Pain Pain Assessment: 0-10 Pain Score: 3  Pain Location: L hip Pain Descriptors / Indicators: Aching;Burning Pain Intervention(s): Limited activity within patient's  tolerance;Monitored during session;Premedicated before session;Ice applied    Home Living Family/patient expects to be discharged to:: Private residence Living Arrangements: Other relatives Available Help at Discharge: Family Type of Home: House Home Access: Stairs to enter Entrance Stairs-Rails: Right Entrance Stairs-Number of Steps: [place]: One level Home Equipment: Environmental consultant - 2 wheels;Cane - single point;Bedside commode      Prior Function Level of Independence: Independent               Hand Dominance        Extremity/Trunk Assessment   Upper Extremity Assessment Upper Extremity Assessment: Overall WFL for tasks assessed    Lower Extremity Assessment Lower Extremity Assessment: LLE deficits/detail LLE Deficits / Details: Strength at hip 2/5 with AAROM at hip to 75 flex and 15 abd       Communication   Communication: No difficulties  Cognition Arousal/Alertness: Awake/alert Behavior During Therapy: WFL for tasks assessed/performed Overall Cognitive Status: Within Functional Limits for tasks assessed                                        General Comments      Exercises Total Joint Exercises Ankle Circles/Pumps: AROM;Both;15 reps;Supine Quad Sets: AROM;Both;10 reps;Supine Heel Slides: AAROM;Left;20 reps;Supine Hip ABduction/ADduction: AAROM;Left;15 reps;Supine   Assessment/Plan    PT Assessment Patient needs continued PT services  PT  Problem List Decreased strength;Decreased range of motion;Decreased activity tolerance;Decreased balance;Decreased mobility;Decreased knowledge of use of DME;Pain;Obesity       PT Treatment Interventions DME instruction;Gait training;Stair training;Functional mobility training;Therapeutic activities;Therapeutic exercise;Patient/family education    PT Goals (Current goals can be found in the Care Plan section)  Acute Rehab PT Goals Patient Stated Goal: Regain IND  PT Goal Formulation: With  patient Time For Goal Achievement: 04/14/18 Potential to Achieve Goals: Good    Frequency 7X/week   Barriers to discharge        Co-evaluation               AM-PAC PT "6 Clicks" Daily Activity  Outcome Measure Difficulty turning over in bed (including adjusting bedclothes, sheets and blankets)?: Unable Difficulty moving from lying on back to sitting on the side of the bed? : Unable Difficulty sitting down on and standing up from a chair with arms (e.g., wheelchair, bedside commode, etc,.)?: Unable Help needed moving to and from a bed to chair (including a wheelchair)?: A Little Help needed walking in hospital room?: A Little Help needed climbing 3-5 steps with a railing? : A Lot 6 Click Score: 11    End of Session Equipment Utilized During Treatment: Gait belt Activity Tolerance: Patient tolerated treatment well Patient left: in chair;with call bell/phone within reach;with chair alarm set Nurse Communication: Mobility status PT Visit Diagnosis: Difficulty in walking, not elsewhere classified (R26.2)    Time: 7939-0300 PT Time Calculation (min) (ACUTE ONLY): 45 min   Charges:   PT Evaluation $PT Eval Low Complexity: 1 Low PT Treatments $Gait Training: 8-22 mins $Therapeutic Exercise: 8-22 mins   PT G Codes:        Pg [phone]   Mccayla Shimada 04/07/2018, 3:33 PM

## 2018-04-07 NOTE — Discharge Summary (Signed)
Physician Discharge Summary  Patient ID: Margaret Cisneros MRN: [id number] DOB/AGE: [birth year] 67 y.o.  Admit date: 04/06/2018 Discharge date: 04/08/2018  Admission Diagnoses:  Osteoarthritis of left hip  Discharge Diagnoses:  Principal Problem:   Osteoarthritis of left hip   Past Medical History:  Diagnosis Date  . Arthritis    right knee arthritis uses celbrex to treat   . Asthma   . GERD (gastroesophageal reflux disease)    severe   . Hypertension   . Pre-diabetes     Surgeries: Procedure(s): LEFT TOTAL HIP ARTHROPLASTY ANTERIOR APPROACH on 04/06/2018   Consultants (if any):   Discharged Condition: Improved  Hospital Course: Margaret Cisneros is an 67 y.o. female who was admitted 04/06/2018 with a diagnosis of Osteoarthritis of left hip and went to the operating room on 04/06/2018 and underwent the above named procedures.    She was given perioperative antibiotics:  Anti-infectives (From admission, onward)   Start     Dose/Rate Route Frequency Ordered Stop   04/06/18 1800  ceFAZolin (ANCEF) IVPB 2g/100 mL premix     2 g 200 mL/hr over 30 Minutes Intravenous Every 6 hours 04/06/18 1621 04/07/18 0040   04/06/18 1114  ceFAZolin (ANCEF) IVPB 2g/100 mL premix     2 g 200 mL/hr over 30 Minutes Intravenous On call to O.R. 04/06/18 1114 04/06/18 1225    .  She was given sequential compression devices, early ambulation, and ASA for DVT prophylaxis.  She benefited maximally from the hospital stay and there were no complications.    Recent vital signs:  Vitals:   04/08/18 0543 04/08/18 1014  BP: (!) 142/59 (!) 156/87  Pulse: 77 80  Resp: 16   Temp: 98 F (36.7 C)   SpO2: 99%     Recent laboratory studies:  Lab Results  Component Value Date   HGB 11.6 (L) 04/08/2018   HGB 12.2 04/07/2018   HGB 14.0 03/29/2018   Lab Results  Component Value Date   WBC 14.1 (H) 04/08/2018   PLT 201 04/08/2018   No results found for: INR Lab Results  Component Value Date   NA 136  04/07/2018   K 4.0 04/07/2018   CL 105 04/07/2018   CO2 24 04/07/2018   BUN 13 04/07/2018   CREATININE 0.74 04/07/2018   GLUCOSE 138 (H) 04/07/2018    Discharge Medications:   Allergies as of 04/08/2018   No Known Allergies     Medication List    STOP taking these medications   aspirin EC 81 MG tablet Replaced by:  aspirin 81 MG chewable tablet     TAKE these medications   albuterol 108 (90 Base) MCG/ACT inhaler Commonly known as:  PROVENTIL HFA;VENTOLIN HFA Inhale 2 puffs into the lungs every 6 (six) hours as needed for wheezing or shortness of breath.   aspirin 81 MG chewable tablet Chew 1 tablet (81 mg total) by mouth 2 (two) times daily. Replaces:  aspirin EC 81 MG tablet   celecoxib 200 MG capsule Commonly known as:  CELEBREX Take 200 mg by mouth 2 (two) times daily.   docusate sodium 100 MG capsule Commonly known as:  COLACE Take 1 capsule (100 mg total) by mouth 2 (two) times daily.   hydrochlorothiazide 12.5 MG tablet Commonly known as:  HYDRODIURIL Take 12.5 mg by mouth daily.   HYDROcodone-acetaminophen 5-325 MG tablet Commonly known as:  NORCO/VICODIN Take 1-2 tablets by mouth every 6 (six) hours as needed for moderate pain.   omeprazole  40 MG capsule Commonly known as:  PRILOSEC Take 40 mg by mouth daily.   ondansetron 4 MG tablet Commonly known as:  ZOFRAN Take 1 tablet (4 mg total) by mouth every 6 (six) hours as needed for nausea.   senna 8.6 MG Tabs tablet Commonly known as:  SENOKOT Take 2 tablets (17.2 mg total) by mouth at bedtime.       Diagnostic Studies: Dg Pelvis Portable  Result Date: 04/06/2018 CLINICAL DATA:  67 y/o  F; status post total left hip replacement. EXAM: PORTABLE PELVIS 1-2 VIEWS COMPARISON:  None. FINDINGS: Total left hip replacement in good alignment. No periprosthetic lucency or fracture. Air and edema in the soft tissues from recent surgery. Mild osteoarthrosis of the right hip joint with loss of joint space. No  pelvic fracture or diastasis. IMPRESSION: Total left hip replacement in good alignment with no apparent hardware related complication. Electronically Signed   By: Kristine Garbe M.D.   On: 04/06/2018 15:20   Dg C-arm 1-60 Min-no Report  Result Date: 04/06/2018 Fluoroscopy was utilized by the requesting physician.  No radiographic interpretation.   Dg Hip Operative Unilat W Or W/o Pelvis Left  Result Date: 04/06/2018 CLINICAL DATA:  Left total hip replacement. EXAM: OPERATIVE LEFT HIP (WITH PELVIS IF PERFORMED) TECHNIQUE: Fluoroscopic spot image(s) were submitted for interpretation post-operatively. COMPARISON:  None. FINDINGS: Sequential intraoperative x-rays demonstrate interval left total hip arthroplasty. Components are well aligned. No acute abnormality. IMPRESSION: Interval left total hip arthroplasty. FLUOROSCOPY TIME:  21 seconds. C-arm fluoroscopic images were obtained intraoperatively and submitted for post operative interpretation. Electronically Signed   By: Titus Dubin M.D.   On: 04/06/2018 14:44    Disposition:    Discharge Instructions    Call MD / Call 911   Complete by:  As directed    If you experience chest pain or shortness of breath, CALL 911 and be transported to the hospital emergency room.  If you develope a fever above 101 F, pus (white drainage) or increased drainage or redness at the wound, or calf pain, call your surgeon's office.   Constipation Prevention   Complete by:  As directed    Drink plenty of fluids.  Prune juice may be helpful.  You may use a stool softener, such as Colace (over the counter) 100 mg twice a day.  Use MiraLax (over the counter) for constipation as needed.   Diet - low sodium heart healthy   Complete by:  As directed    Driving restrictions   Complete by:  As directed    No driving for 6 weeks   Increase activity slowly as tolerated   Complete by:  As directed    Lifting restrictions   Complete by:  As directed    No  lifting for 6 weeks   TED hose   Complete by:  As directed    Use stockings (TED hose) for 2 weeks on both leg(s).  You may remove them at night for sleeping.      Follow-up Information    Mackensie Pilson, Aaron Edelman, MD. Schedule an appointment as soon as possible for a visit in 2 weeks.   Specialty:  Orthopedic Surgery Why:  For wound re-check Contact information: [address] [place] [address] [phone]        [facility] - Dme Follow up.   Why:  3n1 Contact information: [address] [place] [address] [phone]  Signed: Hilton Cork Maciah Schweigert 04/09/2018, 10:51 PM

## 2018-04-07 NOTE — Progress Notes (Signed)
    Subjective:  Patient reports pain as mild to moderate.  Denies N/V/CP/SOB. No c/o. Wants to go home.  Objective:   VITALS:   Vitals:   04/06/18 1929 04/06/18 2202 04/07/18 0102 04/07/18 0457  BP: (!) 149/66 (!) 132/91 138/67 122/85  Pulse: 69 80 72 70  Resp: 20 18 20 20   Temp: (!) 97.5 F (36.4 C) 98.2 F (36.8 C) 97.8 F (36.6 C) 97.9 F (36.6 C)  TempSrc: Oral Oral Oral Oral  SpO2: 98% 99% 97% 95%  Weight:      Height:        NAD ABD soft Sensation intact distally Intact pulses distally Dorsiflexion/Plantar flexion intact Incision: dressing C/D/I Compartment soft   Lab Results  Component Value Date   WBC 12.9 (H) 04/07/2018   HGB 12.2 04/07/2018   HCT 38.0 04/07/2018   MCV 85.0 04/07/2018   PLT 204 04/07/2018   BMET    Component Value Date/Time   NA 136 04/07/2018 0541   K 4.0 04/07/2018 0541   CL 105 04/07/2018 0541   CO2 24 04/07/2018 0541   GLUCOSE 138 (H) 04/07/2018 0541   BUN 13 04/07/2018 0541   CREATININE 0.74 04/07/2018 0541   CALCIUM 8.4 (L) 04/07/2018 0541   GFRNONAA >60 04/07/2018 0541   GFRAA >60 04/07/2018 0541     Assessment/Plan: 1 Day Post-Op   Principal Problem:   Osteoarthritis of left hip   WBAT with walker DVT ppx: Aspirin, SCDs, TEDS PO pain control PT/OT Dispo: D/C home with HEP   Hilton Cork Kazuki Ingle 04/07/2018, 7:41 AM   Rod Can, MD Cell [phone]

## 2018-04-08 LAB — CBC
HCT: 36.9 % (ref 36.0–46.0)
Hemoglobin: 11.6 g/dL — ABNORMAL LOW (ref 12.0–15.0)
MCH: 26.5 pg (ref 26.0–34.0)
MCHC: 31.4 g/dL (ref 30.0–36.0)
MCV: 84.4 fL (ref 78.0–100.0)
PLATELETS: 201 10*3/uL (ref 150–400)
RBC: 4.37 MIL/uL (ref 3.87–5.11)
RDW: 15.7 % — AB (ref 11.5–15.5)
WBC: 14.1 10*3/uL — AB (ref 4.0–10.5)

## 2018-04-08 NOTE — Progress Notes (Signed)
Contacted [facility] for 3n1 bedside commode for home. Jonnie Finner RN CCM Case Mgmt phone [phone]

## 2018-04-08 NOTE — Progress Notes (Signed)
Discharged from floor via w/c for transport home by car. Belongings & family with pt. Margaret Cisneros, [employer]

## 2018-04-08 NOTE — Progress Notes (Signed)
Physical Therapy Treatment Patient Details Name: Margaret Cisneros MRN: [id number] DOB: [birth year] Today's Date: 04/08/2018    History of Present Illness Pt s/p L THR    PT Comments    Pt continues very motivated and progressing well with mobility.  Reviewed stairs, car transfers and home therex program with progression and written instruction provided.  Follow Up Recommendations  Follow surgeon's recommendation for DC plan and follow-up therapies     Equipment Recommendations  None recommended by PT    Recommendations for Other Services OT consult     Precautions / Restrictions Precautions Precautions: Fall Restrictions Weight Bearing Restrictions: No Other Position/Activity Restrictions: WBAT    Mobility  Bed Mobility Overal bed mobility: Needs Assistance Bed Mobility: Supine to Sit     Supine to sit: Supervision     General bed mobility comments: pt self cues for sequence  Transfers Overall transfer level: Needs assistance Equipment used: Rolling walker (2 wheeled) Transfers: Sit to/from Stand Sit to Stand: Supervision         General transfer comment: min cues for LE management and use of UEs to self assist  Ambulation/Gait Ambulation/Gait assistance: Supervision Ambulation Distance (Feet): 150 Feet Assistive device: Rolling walker (2 wheeled) Gait Pattern/deviations: Step-to pattern;Step-through pattern;Decreased step length - right;Decreased step length - left;Shuffle;Trunk flexed Gait velocity: decr   General Gait Details: cues for posture and position from RW   Stairs Stairs: Yes Stairs assistance: Min assist Stair Management: One rail Right;Step to pattern;Forwards;With cane Number of Stairs: 10 General stair comments: cues for sequence and foot/cane placement   Wheelchair Mobility    Modified Rankin (Stroke Patients Only)       Balance Overall balance assessment: No apparent balance deficits (not formally assessed)                                          Cognition Arousal/Alertness: Awake/alert Behavior During Therapy: WFL for tasks assessed/performed Overall Cognitive Status: Within Functional Limits for tasks assessed                                        Exercises Total Joint Exercises Ankle Circles/Pumps: AROM;Both;15 reps;Supine Quad Sets: AROM;Both;10 reps;Supine Heel Slides: AAROM;Left;20 reps;Supine Hip ABduction/ADduction: AAROM;Left;15 reps;Supine Long Arc Quad: AROM;Left;10 reps;Seated    General Comments        Pertinent Vitals/Pain Pain Assessment: 0-10 Pain Score: 3  Pain Location: L hip Pain Descriptors / Indicators: Aching;Burning Pain Intervention(s): Limited activity within patient's tolerance;Monitored during session;Premedicated before session;Ice applied    Home Living                      Prior Function            PT Goals (current goals can now be found in the care plan section) Acute Rehab PT Goals Patient Stated Goal: Regain IND  PT Goal Formulation: With patient Time For Goal Achievement: 04/14/18 Potential to Achieve Goals: Good Progress towards PT goals: Progressing toward goals    Frequency    7X/week      PT Plan Current plan remains appropriate    Co-evaluation              AM-PAC PT "6 Clicks" Daily Activity  Outcome Measure  Difficulty turning over in bed (including  adjusting bedclothes, sheets and blankets)?: A Lot Difficulty moving from lying on back to sitting on the side of the bed? : A Lot Difficulty sitting down on and standing up from a chair with arms (e.g., wheelchair, bedside commode, etc,.)?: A Lot Help needed moving to and from a bed to chair (including a wheelchair)?: A Little Help needed walking in hospital room?: A Little Help needed climbing 3-5 steps with a railing? : A Little 6 Click Score: 15    End of Session Equipment Utilized During Treatment: Gait belt Activity Tolerance:  Patient tolerated treatment well Patient left: with call bell/phone within reach;in bed Nurse Communication: Mobility status PT Visit Diagnosis: Difficulty in walking, not elsewhere classified (R26.2)     Time: 1010-1055 PT Time Calculation (min) (ACUTE ONLY): 45 min  Charges:  $Gait Training: 8-22 mins $Therapeutic Exercise: 8-22 mins $Therapeutic Activity: 8-22 mins                    G Codes:       Pg [phone]    Lynnita Somma 04/08/2018, 2:28 PM

## 2018-04-08 NOTE — Progress Notes (Addendum)
     Subjective: 2 Days Post-Op Procedure(s) (LRB): LEFT TOTAL HIP ARTHROPLASTY ANTERIOR APPROACH (Left)   Patient reports pain as mild, pain controlled. No events throughout the night. She is very pleasant to talk to.  Looking forward to working with PT.  Ready to be discharged home.  Objective:   VITALS:   Vitals:   04/07/18 2116 04/08/18 0543  BP: (!) 151/90 (!) 142/59  Pulse: 94 77  Resp: 19 16  Temp: 98.3 F (36.8 C) 98 F (36.7 C)  SpO2: 92% 99%    Dorsiflexion/Plantar flexion intact Incision: dressing C/D/I No cellulitis present Compartment soft  LABS Recent Labs    04/07/18 0541 04/08/18 0522  HGB 12.2 11.6*  HCT 38.0 36.9  WBC 12.9* 14.1*  PLT 204 201    Recent Labs    04/07/18 0541  NA 136  K 4.0  BUN 13  CREATININE 0.74  GLUCOSE 138*     Assessment/Plan: 2 Days Post-Op Procedure(s) (LRB): LEFT TOTAL HIP ARTHROPLASTY ANTERIOR APPROACH (Left) Up with therapy Discharge home Follow up in 2 weeks at [facility]. Follow up with Dr. Lyla Glassing in 2 weeks.  Contact information:  [facility] [address] [place] Margaret Cisneros   PAC  04/08/2018, 8:48 AM

## 2019-01-10 ENCOUNTER — Ambulatory Visit: Payer: Self-pay | Admitting: Orthopedic Surgery

## 2019-02-05 ENCOUNTER — Ambulatory Visit: Payer: Self-pay | Admitting: Orthopedic Surgery

## 2019-02-05 NOTE — H&P (View-Only) (Signed)
Margaret Cisneros is an 68 y.o. female.   Chief Complaint: Left knee pain HPI: The patient is going to have a left total knee arthroplasty performed by Dr. Susa Day on 02/14/2019 at [facility].  Dr. Tonita Cong and the patient mutually agreed to proceed with a total knee replacement. Risks and benefits of the procedure were discussed including stiffness, suboptimal range of motion, persistent pain, infection requiring removal of prosthesis and reinsertion, need for prophylactic antibiotics in the future, for example, dental procedures, possible need for manipulation, revision in the future and also anesthetic complications including DVT, PE, etc. We discussed the perioperative course, time in the hospital, postoperative recovery and the need for elevation to control swelling. We also discussed the predicted range of motion and the probability that squatting and kneeling would be unobtainable in the future. In addition, postoperative anticoagulation was discussed. We have obtained preoperative medical clearance as necessary. Provided illustrated handout and discussed it in detail. They will enroll in the total joint replacement educational forum at the hospital.  Past Medical History:  Diagnosis Date  . Arthritis    right knee arthritis uses celbrex to treat   . Asthma   . GERD (gastroesophageal reflux disease)    severe   . Hypertension   . Pre-diabetes     Past Surgical History:  Procedure Laterality Date  . NO PAST SURGERIES    . TOTAL HIP ARTHROPLASTY Left 04/06/2018   Procedure: LEFT TOTAL HIP ARTHROPLASTY ANTERIOR APPROACH;  Surgeon: Rod Can, MD;  Location: [facility];  Service: Orthopedics;  Laterality: Left;  Needs RNFA    No family history on file. Social History:  reports that she quit smoking about 20 years ago. Her smoking use included cigarettes. She quit after 3.00 years of use. She has never used smokeless tobacco. She reports previous alcohol use. No history on file for  drug.  Allergies: No Known Allergies  Medications: albuterol sulfate HFA 90 mcg/actuation aerosol inhaler aspirin 81 mg chewable tablet celecoxib 200 mg capsule ergocalciferol (vitamin D2) 1,250 mcg (50,000 unit) capsule hydroCHLOROthiazide 12.5 mg tablet hydroCHLOROthiazide 25 mg tablet omeprazole 40 mg capsule,delayed release TylenoL  Review of Systems  Constitutional: Negative.   HENT: Negative.   Eyes: Negative.   Respiratory: Negative.   Cardiovascular: Negative.   Gastrointestinal: Negative.   Genitourinary: Negative.   Musculoskeletal: Positive for joint pain.  Skin: Negative.   Neurological: Negative.   Psychiatric/Behavioral: Negative.     There were no vitals taken for this visit. Physical Exam  Constitutional: She is oriented to person, place, and time. She appears well-developed and well-nourished.  HENT:  Head: Normocephalic.  Eyes: Pupils are equal, round, and reactive to light.  Neck: Normal range of motion.  Cardiovascular: Normal rate.  Respiratory: Effort normal.  GI: Soft.  Musculoskeletal:     Comments: Patient is a 68 year old female.  General Appearance: healthy-appearing, NAD  Gait and Station Appearance: antalgic gait  Knees Inspection Right: no deformity Inspection Left: no deformity, swelling Bony Palpation Right: no tenderness of the superior pole patella, no tenderness of the tibial tubercle, no tenderness of the medial tibial plateau, no tenderness of Gerdy's tubercle, no tenderness of the neck of fibula, tenderness of the inferior pole patella, tenderness of the lateral joint line, tenderness of the medial joint line Bony Palpation Left: no tenderness of the superior pole patella, no tenderness of the inferior pole patella, no tenderness of the tibial tubercle, no tenderness of the medial joint line, no tenderness of the  medial tibial plateau, no tenderness of Gerdy's tubercle, no tenderness of the neck of fibula, tenderness of the  medial femoral condyle, tenderness of the lateral joint line Soft Tissue Palpation Right: no tenderness of the quadriceps tendon, no tenderness of the prepatellar bursa, no tenderness of the patellar tendon, no tenderness of the medial collateral ligament, no tenderness of the saphenous nerve, no tenderness of the lateral collateral ligament, no tenderness of the infrapatellar tendon, no tenderness of the common peroneal nerve Soft Tissue Palpation Left: no tenderness of the quadriceps tendon, no tenderness of the prepatellar bursa, no tenderness of the patellar tendon, no tenderness of the medial collateral ligament, no tenderness of the infrapatellar tendon Active Range of Motion Right: limited Active Range of Motion Left: limited Stability Right: no laxity, no ligamentous instability, anterior drawer sign negative, posterior drawer sign negative, Lachman test negative Stability Left: no laxity, no ligamentous instability, anterior drawer sign negative, Lachman test negative Special Tests Right: McMurray's test negative Special Tests Left: McMurray's test negative Strength Right: flexion 5/5, extension 5/5, no hamstring weakness, no quadriceps weakness Strength Left: flexion 5/5, extension 5/5, no hamstring weakness, no quadriceps weakness  Straight leg raise is negative. No instability hips knees and ankles.  Neurological: She is alert and oriented to person, place, and time.  Skin: Skin is warm and dry.   X-rays with bilateral end-stage medial bone-on-bone and patellofemoral degenerative changes.  Assessment/Plan Impression: End-stage left knee DJD  Plan: Pt with end-stage left knee DJD, bone-on-bone, refractory to conservative tx, scheduled for left total knee replacement by Dr. Tonita Cong on 02/14/2019. We again discussed the procedure itself as well as risks, complications and alternatives, including but not limited to DVT, PE, infx, bleeding, failure of procedure, need for secondary procedure  including manipulation, nerve injury, ongoing pain/symptoms, anesthesia risk, even stroke or death. Also discussed typical post-op protocols, activity restrictions, need for PT, flexion/extension exercises, time out of work. Discussed need for DVT ppx post-op per protocol. Discussed dental ppx and infx prevention. Also discussed limitations post-operatively such as kneeling and squatting. All questions were answered. Patient desires to proceed with surgery as scheduled.  Will hold supplements, ASA and NSAIDs accordingly. Will remain NPO after midnight the night before surgery. Will present to [facility] for pre-op testing. Anticipate hospital stay to include at least 2 midnights given medical history and to ensure proper pain control. Plan ASA for DVT ppx post-op. Plan Tramadol, Robaxin, Colace, Miralax. Plan outpt PT with option of home with HHPT post-op with family members at home for assistance if needed depending on her progress. Will follow up 10-14 days post-op for suture removal and xrays.  Anticipated LOS equal to or greater than 2 midnights due to - Age 9 and older with one or more of the following:  - Obesity  - Expected need for hospital services (PT, OT, Nursing) required for safe  discharge  - Anticipated need for postoperative skilled nursing care or inpatient rehab  - Active co-morbidities: None  Plan left total knee replacement  Cecilie Kicks, PA-C for Dr. Tonita Cong 02/05/2019, 12:02 PM

## 2019-02-05 NOTE — H&P (Signed)
Margaret Cisneros is an 68 y.o. female.   Chief Complaint: Left knee pain HPI: The patient is going to have a left total knee arthroplasty performed by Dr. Susa Day on 02/14/2019 at [facility].  Dr. Tonita Cong and the patient mutually agreed to proceed with a total knee replacement. Risks and benefits of the procedure were discussed including stiffness, suboptimal range of motion, persistent pain, infection requiring removal of prosthesis and reinsertion, need for prophylactic antibiotics in the future, for example, dental procedures, possible need for manipulation, revision in the future and also anesthetic complications including DVT, PE, etc. We discussed the perioperative course, time in the hospital, postoperative recovery and the need for elevation to control swelling. We also discussed the predicted range of motion and the probability that squatting and kneeling would be unobtainable in the future. In addition, postoperative anticoagulation was discussed. We have obtained preoperative medical clearance as necessary. Provided illustrated handout and discussed it in detail. They will enroll in the total joint replacement educational forum at the hospital.  Past Medical History:  Diagnosis Date  . Arthritis    right knee arthritis uses celbrex to treat   . Asthma   . GERD (gastroesophageal reflux disease)    severe   . Hypertension   . Pre-diabetes     Past Surgical History:  Procedure Laterality Date  . NO PAST SURGERIES    . TOTAL HIP ARTHROPLASTY Left 04/06/2018   Procedure: LEFT TOTAL HIP ARTHROPLASTY ANTERIOR APPROACH;  Surgeon: Rod Can, MD;  Location: [facility];  Service: Orthopedics;  Laterality: Left;  Needs RNFA    No family history on file. Social History:  reports that she quit smoking about 20 years ago. Her smoking use included cigarettes. She quit after 3.00 years of use. She has never used smokeless tobacco. She reports previous alcohol use. No history on file for  drug.  Allergies: No Known Allergies  Medications: albuterol sulfate HFA 90 mcg/actuation aerosol inhaler aspirin 81 mg chewable tablet celecoxib 200 mg capsule ergocalciferol (vitamin D2) 1,250 mcg (50,000 unit) capsule hydroCHLOROthiazide 12.5 mg tablet hydroCHLOROthiazide 25 mg tablet omeprazole 40 mg capsule,delayed release TylenoL  Review of Systems  Constitutional: Negative.   HENT: Negative.   Eyes: Negative.   Respiratory: Negative.   Cardiovascular: Negative.   Gastrointestinal: Negative.   Genitourinary: Negative.   Musculoskeletal: Positive for joint pain.  Skin: Negative.   Neurological: Negative.   Psychiatric/Behavioral: Negative.     There were no vitals taken for this visit. Physical Exam  Constitutional: She is oriented to person, place, and time. She appears well-developed and well-nourished.  HENT:  Head: Normocephalic.  Eyes: Pupils are equal, round, and reactive to light.  Neck: Normal range of motion.  Cardiovascular: Normal rate.  Respiratory: Effort normal.  GI: Soft.  Musculoskeletal:     Comments: Patient is a 68 year old female.  General Appearance: healthy-appearing, NAD  Gait and Station Appearance: antalgic gait  Knees Inspection Right: no deformity Inspection Left: no deformity, swelling Bony Palpation Right: no tenderness of the superior pole patella, no tenderness of the tibial tubercle, no tenderness of the medial tibial plateau, no tenderness of Gerdy's tubercle, no tenderness of the neck of fibula, tenderness of the inferior pole patella, tenderness of the lateral joint line, tenderness of the medial joint line Bony Palpation Left: no tenderness of the superior pole patella, no tenderness of the inferior pole patella, no tenderness of the tibial tubercle, no tenderness of the medial joint line, no tenderness of the  medial tibial plateau, no tenderness of Gerdy's tubercle, no tenderness of the neck of fibula, tenderness of the  medial femoral condyle, tenderness of the lateral joint line Soft Tissue Palpation Right: no tenderness of the quadriceps tendon, no tenderness of the prepatellar bursa, no tenderness of the patellar tendon, no tenderness of the medial collateral ligament, no tenderness of the saphenous nerve, no tenderness of the lateral collateral ligament, no tenderness of the infrapatellar tendon, no tenderness of the common peroneal nerve Soft Tissue Palpation Left: no tenderness of the quadriceps tendon, no tenderness of the prepatellar bursa, no tenderness of the patellar tendon, no tenderness of the medial collateral ligament, no tenderness of the infrapatellar tendon Active Range of Motion Right: limited Active Range of Motion Left: limited Stability Right: no laxity, no ligamentous instability, anterior drawer sign negative, posterior drawer sign negative, Lachman test negative Stability Left: no laxity, no ligamentous instability, anterior drawer sign negative, Lachman test negative Special Tests Right: McMurray's test negative Special Tests Left: McMurray's test negative Strength Right: flexion 5/5, extension 5/5, no hamstring weakness, no quadriceps weakness Strength Left: flexion 5/5, extension 5/5, no hamstring weakness, no quadriceps weakness  Straight leg raise is negative. No instability hips knees and ankles.  Neurological: She is alert and oriented to person, place, and time.  Skin: Skin is warm and dry.   X-rays with bilateral end-stage medial bone-on-bone and patellofemoral degenerative changes.  Assessment/Plan Impression: End-stage left knee DJD  Plan: Pt with end-stage left knee DJD, bone-on-bone, refractory to conservative tx, scheduled for left total knee replacement by Dr. Tonita Cong on 02/14/2019. We again discussed the procedure itself as well as risks, complications and alternatives, including but not limited to DVT, PE, infx, bleeding, failure of procedure, need for secondary procedure  including manipulation, nerve injury, ongoing pain/symptoms, anesthesia risk, even stroke or death. Also discussed typical post-op protocols, activity restrictions, need for PT, flexion/extension exercises, time out of work. Discussed need for DVT ppx post-op per protocol. Discussed dental ppx and infx prevention. Also discussed limitations post-operatively such as kneeling and squatting. All questions were answered. Patient desires to proceed with surgery as scheduled.  Will hold supplements, ASA and NSAIDs accordingly. Will remain NPO after midnight the night before surgery. Will present to [facility] for pre-op testing. Anticipate hospital stay to include at least 2 midnights given medical history and to ensure proper pain control. Plan ASA for DVT ppx post-op. Plan Tramadol, Robaxin, Colace, Miralax. Plan outpt PT with option of home with HHPT post-op with family members at home for assistance if needed depending on her progress. Will follow up 10-14 days post-op for suture removal and xrays.  Anticipated LOS equal to or greater than 2 midnights due to - Age 78 and older with one or more of the following:  - Obesity  - Expected need for hospital services (PT, OT, Nursing) required for safe  discharge  - Anticipated need for postoperative skilled nursing care or inpatient rehab  - Active co-morbidities: None  Plan left total knee replacement  Cecilie Kicks, PA-C for Dr. Tonita Cong 02/05/2019, 12:02 PM

## 2019-02-08 NOTE — Patient Instructions (Addendum)
JEANNINE PENNISI  02/08/2019   Your procedure is scheduled on: Wednesday 02/14/2019  Report to [facility] Main  Entrance              Report to admitting at  0600 AM     Call this number if you have problems the morning of surgery [phone]    Remember: Do not eat food or drink liquids :After Midnight. BRUSH YOUR TEETH MORNING OF SURGERY AND RINSE YOUR MOUTH OUT, NO CHEWING GUM CANDY OR MINT    Take these medicines the morning of surgery with A SIP OF WATER: Omeprazole (Prilosec), use Albuterol inhaler if needed                                 You may not have any metal on your body including hair pins and              piercings  Do not wear jewelry, make-up, lotions, powders or perfumes, deodorant             Do not wear nail polish.  Do not shave  48 hours prior to surgery.              Do not bring valuables to the hospital. [place].  Contacts, dentures or bridgework may not be worn into surgery.  Leave suitcase in the car. After surgery it may be brought to your room.                  Please read over the following fact sheets you were given: _____________________________________________________________________             [facility] - Preparing for Surgery Before surgery, you can play an important role.  Because skin is not sterile, your skin needs to be as free of germs as possible.  You can reduce the number of germs on your skin by washing with CHG (chlorahexidine gluconate) soap before surgery.  CHG is an antiseptic cleaner which kills germs and bonds with the skin to continue killing germs even after washing. Please DO NOT use if you have an allergy to CHG or antibacterial soaps.  If your skin becomes reddened/irritated stop using the CHG and inform your nurse when you arrive at Short Stay. Do not shave (including legs and underarms) for at least 48 hours prior to the first CHG shower.  You  may shave your face/neck. Please follow these instructions carefully:  1.  Shower with CHG Soap the night before surgery and the  morning of Surgery.  2.  If you choose to wash your hair, wash your hair first as usual with your  normal  shampoo.  3.  After you shampoo, rinse your hair and body thoroughly to remove the  shampoo.                           4.  Use CHG as you would any other liquid soap.  You can apply chg directly  to the skin and wash                       Gently with a scrungie or clean washcloth.  5.  Apply the CHG Soap to your body ONLY FROM THE NECK DOWN.   Do not use on face/ open                           Wound or open sores. Avoid contact with eyes, ears mouth and genitals (private parts).                       Wash face,  Genitals (private parts) with your normal soap.             6.  Wash thoroughly, paying special attention to the area where your surgery  will be performed.  7.  Thoroughly rinse your body with warm water from the neck down.  8.  DO NOT shower/wash with your normal soap after using and rinsing off  the CHG Soap.                9.  Pat yourself dry with a clean towel.            10.  Wear clean pajamas.            11.  Place clean sheets on your bed the night of your first shower and do not  sleep with pets. Day of Surgery : Do not apply any lotions/deodorants the morning of surgery.  Please wear clean clothes to the hospital/surgery center.  FAILURE TO FOLLOW THESE INSTRUCTIONS MAY RESULT IN THE CANCELLATION OF YOUR SURGERY PATIENT SIGNATURE_________________________________  NURSE SIGNATURE__________________________________  ________________________________________________________________________   Adam Phenix  An incentive spirometer is a tool that can help keep your lungs clear and active. This tool measures how well you are filling your lungs with each breath. Taking long deep breaths may help reverse or decrease the chance of  developing breathing (pulmonary) problems (especially infection) following:  A long period of time when you are unable to move or be active. BEFORE THE PROCEDURE   If the spirometer includes an indicator to show your best effort, your nurse or respiratory therapist will set it to a desired goal.  If possible, sit up straight or lean slightly forward. Try not to slouch.  Hold the incentive spirometer in an upright position. INSTRUCTIONS FOR USE  1. Sit on the edge of your bed if possible, or sit up as far as you can in bed or on a chair. 2. Hold the incentive spirometer in an upright position. 3. Breathe out normally. 4. Place the mouthpiece in your mouth and seal your lips tightly around it. 5. Breathe in slowly and as deeply as possible, raising the piston or the ball toward the top of the column. 6. Hold your breath for 3-5 seconds or for as long as possible. Allow the piston or ball to fall to the bottom of the column. 7. Remove the mouthpiece from your mouth and breathe out normally. 8. Rest for a few seconds and repeat Steps 1 through 7 at least 10 times every 1-2 hours when you are awake. Take your time and take a few normal breaths between deep breaths. 9. The spirometer may include an indicator to show your best effort. Use the indicator as a goal to work toward during each repetition. 10. After each set of 10 deep breaths, practice coughing to be sure your lungs are clear. If you have an incision (the cut made at the time of surgery), support your incision when coughing by placing a  pillow or rolled up towels firmly against it. Once you are able to get out of bed, walk around indoors and cough well. You may stop using the incentive spirometer when instructed by your caregiver.  RISKS AND COMPLICATIONS  Take your time so you do not get dizzy or light-headed.  If you are in pain, you may need to take or ask for pain medication before doing incentive spirometry. It is harder to take a  deep breath if you are having pain. AFTER USE  Rest and breathe slowly and easily.  It can be helpful to keep track of a log of your progress. Your caregiver can provide you with a simple table to help with this. If you are using the spirometer at home, follow these instructions: [place] IF:   You are having difficultly using the spirometer.  You have trouble using the spirometer as often as instructed.  Your pain medication is not giving enough relief while using the spirometer.  You develop fever of 100.5 F (38.1 C) or higher. SEEK IMMEDIATE MEDICAL CARE IF:   You cough up bloody sputum that had not been present before.  You develop fever of 102 F (38.9 C) or greater.  You develop worsening pain at or near the incision site. MAKE SURE YOU:   Understand these instructions.  Will watch your condition.  Will get help right away if you are not doing well or get worse. Document Released: 04/04/2007 Document Revised: 02/14/2012 Document Reviewed: 06/05/2007 [facility] Patient Information 2014 [place], [place].   ________________________________________________________________________

## 2019-02-08 NOTE — Progress Notes (Addendum)
12/21/2018- EKG and labs-Lipid panel, CMP, CBC, Folate serum, TSH,Vitamin B12, Vitamin D 25, HgA1C on chart from Dr. Jeanie Cooks  04/06/2018- noted in [place]

## 2019-02-09 ENCOUNTER — Encounter (HOSPITAL_COMMUNITY): Payer: Self-pay

## 2019-02-09 ENCOUNTER — Other Ambulatory Visit: Payer: Self-pay

## 2019-02-09 ENCOUNTER — Encounter (HOSPITAL_COMMUNITY)
Admission: RE | Admit: 2019-02-09 | Discharge: 2019-02-09 | Disposition: A | Discharge status: Home / Self Care | Payer: Medicare HMO | Source: Ambulatory Visit | Attending: Specialist | Admitting: Specialist

## 2019-02-09 DIAGNOSIS — Z01812 Encounter for preprocedural laboratory examination: Secondary | ICD-10-CM | POA: Insufficient documentation

## 2019-02-09 DIAGNOSIS — M1712 Unilateral primary osteoarthritis, left knee: Secondary | ICD-10-CM | POA: Diagnosis not present

## 2019-02-09 HISTORY — DX: Family history of other specified conditions: Z84.89

## 2019-02-09 LAB — URINALYSIS, ROUTINE W REFLEX MICROSCOPIC
Bilirubin Urine: NEGATIVE
GLUCOSE, UA: NEGATIVE mg/dL
Ketones, ur: NEGATIVE mg/dL
Leukocytes,Ua: NEGATIVE
Nitrite: NEGATIVE
Protein, ur: NEGATIVE mg/dL
Specific Gravity, Urine: 1.018 (ref 1.005–1.030)
pH: 5 (ref 5.0–8.0)

## 2019-02-09 LAB — SURGICAL PCR SCREEN
MRSA, PCR: NEGATIVE
Staphylococcus aureus: NEGATIVE

## 2019-02-09 LAB — BASIC METABOLIC PANEL
Anion gap: 11 (ref 5–15)
BUN: 15 mg/dL (ref 8–23)
CO2: 29 mmol/L (ref 22–32)
Calcium: 9.1 mg/dL (ref 8.9–10.3)
Chloride: 99 mmol/L (ref 98–111)
Creatinine, Ser: 0.78 mg/dL (ref 0.44–1.00)
Glucose, Bld: 136 mg/dL — ABNORMAL HIGH (ref 70–99)
Potassium: 2.9 mmol/L — ABNORMAL LOW (ref 3.5–5.1)
Sodium: 139 mmol/L (ref 135–145)

## 2019-02-09 LAB — CBC
HCT: 45.5 % (ref 36.0–46.0)
HEMOGLOBIN: 13.8 g/dL (ref 12.0–15.0)
MCH: 25.9 pg — ABNORMAL LOW (ref 26.0–34.0)
MCHC: 30.3 g/dL (ref 30.0–36.0)
MCV: 85.4 fL (ref 80.0–100.0)
Platelets: 244 10*3/uL (ref 150–400)
RBC: 5.33 MIL/uL — ABNORMAL HIGH (ref 3.87–5.11)
RDW: 15.9 % — ABNORMAL HIGH (ref 11.5–15.5)
WBC: 8.5 10*3/uL (ref 4.0–10.5)
nRBC: 0 % (ref 0.0–0.2)

## 2019-02-09 LAB — PROTIME-INR
INR: 1 (ref 0.8–1.2)
Prothrombin Time: 13.4 seconds (ref 11.4–15.2)

## 2019-02-09 LAB — APTT: aPTT: 28 seconds (ref 24–36)

## 2019-02-09 NOTE — Progress Notes (Signed)
Chart given to Iver Nestle, PA Anesthesia to review results from U/A and BMP from 02/09/2019.

## 2019-02-12 NOTE — Anesthesia Preprocedure Evaluation (Addendum)
Anesthesia Evaluation  Patient identified by MRN, date of birth, ID band Patient awake    Reviewed: Allergy & Precautions, NPO status , Patient's Chart, lab work & pertinent test results  History of Anesthesia Complications Negative for: history of anesthetic complications  Airway Mallampati: II  TM Distance: >3 FB Neck ROM: Full    Dental  (+) Dental Advisory Given   Pulmonary COPD,  COPD inhaler, former smoker (quit 2000),    breath sounds clear to auscultation       Cardiovascular hypertension, Pt. on medications (-) angina Rhythm:Regular Rate:Normal     Neuro/Psych negative neurological ROS     GI/Hepatic Neg liver ROS, GERD  Controlled and Medicated,  Endo/Other  obesity  Renal/GU negative Renal ROS     Musculoskeletal  (+) Arthritis , Osteoarthritis,    Abdominal (+) + obese,   Peds  Hematology negative hematology ROS (+)   Anesthesia Other Findings   Reproductive/Obstetrics                           Anesthesia Physical Anesthesia Plan  ASA: II  Anesthesia Plan: Spinal   Post-op Pain Management:  Regional for Post-op pain   Induction:   PONV Risk Score and Plan: 2 and Ondansetron and Treatment may vary due to age or medical condition  Airway Management Planned: Natural Airway and Simple Face Mask  Additional Equipment:   Intra-op Plan:   Post-operative Plan:   Informed Consent: I have reviewed the patients History and Physical, chart, labs and discussed the procedure including the risks, benefits and alternatives for the proposed anesthesia with the patient or authorized representative who has indicated his/her understanding and acceptance.     Dental advisory given  Plan Discussed with: CRNA and Surgeon  Anesthesia Plan Comments: (K 2.9 at PAT visit 02/09/19, surgeon made aware, will order DOS BMP. Konrad Felix, PA-C)       Anesthesia Quick Evaluation

## 2019-02-14 ENCOUNTER — Ambulatory Visit (HOSPITAL_COMMUNITY): Payer: Medicare Other | Admitting: Physician Assistant

## 2019-02-14 ENCOUNTER — Encounter (HOSPITAL_COMMUNITY): Payer: Self-pay | Admitting: *Deleted

## 2019-02-14 ENCOUNTER — Encounter (HOSPITAL_COMMUNITY)
Admission: RE | Disposition: A | Discharge status: Home / Self Care | Payer: Self-pay | Source: Home / Self Care | Attending: Specialist

## 2019-02-14 ENCOUNTER — Inpatient Hospital Stay (HOSPITAL_COMMUNITY)
Admission: RE | Admit: 2019-02-14 | Discharge: 2019-02-17 | DRG: 470 | Disposition: A | Discharge status: Home / Self Care | Payer: Medicare Other | Attending: Specialist | Admitting: Specialist

## 2019-02-14 ENCOUNTER — Inpatient Hospital Stay (HOSPITAL_COMMUNITY): Payer: Medicare Other

## 2019-02-14 ENCOUNTER — Other Ambulatory Visit: Payer: Self-pay

## 2019-02-14 DIAGNOSIS — I10 Essential (primary) hypertension: Secondary | ICD-10-CM | POA: Diagnosis not present

## 2019-02-14 DIAGNOSIS — M25762 Osteophyte, left knee: Secondary | ICD-10-CM | POA: Diagnosis not present

## 2019-02-14 DIAGNOSIS — Z7982 Long term (current) use of aspirin: Secondary | ICD-10-CM | POA: Diagnosis not present

## 2019-02-14 DIAGNOSIS — Z6833 Body mass index (BMI) 33.0-33.9, adult: Secondary | ICD-10-CM

## 2019-02-14 DIAGNOSIS — Z96642 Presence of left artificial hip joint: Secondary | ICD-10-CM | POA: Diagnosis present

## 2019-02-14 DIAGNOSIS — E669 Obesity, unspecified: Secondary | ICD-10-CM | POA: Diagnosis not present

## 2019-02-14 DIAGNOSIS — Z79899 Other long term (current) drug therapy: Secondary | ICD-10-CM | POA: Diagnosis not present

## 2019-02-14 DIAGNOSIS — Z87891 Personal history of nicotine dependence: Secondary | ICD-10-CM | POA: Diagnosis not present

## 2019-02-14 DIAGNOSIS — Z96659 Presence of unspecified artificial knee joint: Secondary | ICD-10-CM

## 2019-02-14 DIAGNOSIS — K219 Gastro-esophageal reflux disease without esophagitis: Secondary | ICD-10-CM | POA: Diagnosis present

## 2019-02-14 DIAGNOSIS — M1712 Unilateral primary osteoarthritis, left knee: Principal | ICD-10-CM

## 2019-02-14 DIAGNOSIS — R7303 Prediabetes: Secondary | ICD-10-CM | POA: Diagnosis present

## 2019-02-14 HISTORY — PX: TOTAL KNEE ARTHROPLASTY: SHX125

## 2019-02-14 LAB — BASIC METABOLIC PANEL
Anion gap: 13 (ref 5–15)
Anion gap: 10 (ref 5–15)
BUN: 18 mg/dL (ref 8–23)
BUN: 14 mg/dL (ref 8–23)
CO2: 26 mmol/L (ref 22–32)
CO2: 32 mmol/L (ref 22–32)
Calcium: 9.4 mg/dL (ref 8.9–10.3)
Calcium: 8.8 mg/dL — ABNORMAL LOW (ref 8.9–10.3)
Chloride: 99 mmol/L (ref 98–111)
Chloride: 96 mmol/L — ABNORMAL LOW (ref 98–111)
Creatinine, Ser: 0.92 mg/dL (ref 0.44–1.00)
Creatinine, Ser: 0.8 mg/dL (ref 0.44–1.00)
GFR calc Af Amer: >60 mL/min (ref >60)
GFR calc Af Amer: >60 mL/min (ref >60)
GFR calc non Af Amer: >60 mL/min (ref >60)
GFR calc non Af Amer: >60 mL/min (ref >60)
GLUCOSE: 105 mg/dL — AB (ref 70–99)
Glucose, Bld: 145 mg/dL — ABNORMAL HIGH (ref 70–99)
Potassium: 2.9 mmol/L — ABNORMAL LOW (ref 3.5–5.1)
Potassium: 3.1 mmol/L — ABNORMAL LOW (ref 3.5–5.1)
Sodium: 138 mmol/L (ref 135–145)
Sodium: 138 mmol/L (ref 135–145)

## 2019-02-14 SURGERY — ARTHROPLASTY, KNEE, TOTAL
Anesthesia: Spinal | Laterality: Left

## 2019-02-14 MED ORDER — STERILE WATER FOR IRRIGATION IR SOLN
Status: DC | PRN
  Administered 2019-02-14: 2000 mL

## 2019-02-14 MED ORDER — BUPIVACAINE-EPINEPHRINE (PF) 0.5% -1:200000 IJ SOLN
INTRAMUSCULAR | Status: DC | PRN
  Administered 2019-02-14: 20 mL via PERINEURAL

## 2019-02-14 MED ORDER — SODIUM CHLORIDE 0.9 % IV SOLN
INTRAVENOUS | Status: DC | PRN
  Administered 2019-02-14: 500 mL

## 2019-02-14 MED ORDER — CEFAZOLIN SODIUM-DEXTROSE 2-4 GM/100ML-% IV SOLN
2.0000 g | INTRAVENOUS | Status: AC
  Administered 2019-02-14: 2 g via INTRAVENOUS
  Filled 2019-02-14: qty 100

## 2019-02-14 MED ORDER — ONDANSETRON HCL 4 MG PO TABS
4.0000 mg | ORAL_TABLET | Freq: Four times a day (QID) | ORAL | Status: DC | PRN
  Administered 2019-02-16: 4 mg via ORAL
  Filled 2019-02-14: qty 1

## 2019-02-14 MED ORDER — OXYCODONE HCL 5 MG PO TABS
5.0000 mg | ORAL_TABLET | ORAL | Status: DC | PRN
  Administered 2019-02-14: 10 mg via ORAL
  Administered 2019-02-14: 5 mg via ORAL
  Administered 2019-02-15: 10 mg via ORAL
  Filled 2019-02-14: qty 1
  Filled 2019-02-14 (×4): qty 2

## 2019-02-14 MED ORDER — BUPIVACAINE-EPINEPHRINE (PF) 0.25% -1:200000 IJ SOLN
INTRAMUSCULAR | Status: AC
  Filled 2019-02-14: qty 60

## 2019-02-14 MED ORDER — HYDROMORPHONE HCL 1 MG/ML IJ SOLN
0.5000 mg | INTRAMUSCULAR | Status: DC | PRN
  Administered 2019-02-15: 1 mg via INTRAVENOUS
  Filled 2019-02-14 (×2): qty 1

## 2019-02-14 MED ORDER — PROPOFOL 10 MG/ML IV BOLUS
INTRAVENOUS | Status: AC
  Filled 2019-02-14: qty 60

## 2019-02-14 MED ORDER — PROPOFOL 10 MG/ML IV BOLUS
INTRAVENOUS | Status: AC
  Filled 2019-02-14: qty 40

## 2019-02-14 MED ORDER — ONDANSETRON HCL 4 MG/2ML IJ SOLN
4.0000 mg | Freq: Four times a day (QID) | INTRAMUSCULAR | Status: DC | PRN
  Administered 2019-02-14: 4 mg via INTRAVENOUS
  Filled 2019-02-14: qty 2

## 2019-02-14 MED ORDER — CEFAZOLIN SODIUM-DEXTROSE 2-4 GM/100ML-% IV SOLN
2.0000 g | Freq: Four times a day (QID) | INTRAVENOUS | Status: AC
  Administered 2019-02-14 (×2): 2 g via INTRAVENOUS
  Filled 2019-02-14 (×2): qty 100

## 2019-02-14 MED ORDER — ALBUTEROL SULFATE HFA 108 (90 BASE) MCG/ACT IN AERS: 2.0000 | INHALATION_SPRAY | Freq: Four times a day (QID) | RESPIRATORY_TRACT | Status: DC | PRN

## 2019-02-14 MED ORDER — PHENOL 1.4 % MT LIQD: 1.0000 | OROMUCOSAL | Status: DC | PRN

## 2019-02-14 MED ORDER — MIDAZOLAM HCL 2 MG/2ML IJ SOLN: 0.5000 mg | Freq: Once | INTRAMUSCULAR | Status: DC | PRN

## 2019-02-14 MED ORDER — ACETAMINOPHEN 10 MG/ML IV SOLN
1000.0000 mg | INTRAVENOUS | Status: AC
  Administered 2019-02-14: 1000 mg via INTRAVENOUS
  Filled 2019-02-14: qty 100

## 2019-02-14 MED ORDER — BUPIVACAINE IN DEXTROSE 0.75-8.25 % IT SOLN
INTRATHECAL | Status: DC | PRN
  Administered 2019-02-14: 2 mL via INTRATHECAL

## 2019-02-14 MED ORDER — ONDANSETRON HCL 4 MG/2ML IJ SOLN
INTRAMUSCULAR | Status: DC | PRN
  Administered 2019-02-14: 4 mg via INTRAVENOUS

## 2019-02-14 MED ORDER — METOCLOPRAMIDE HCL 5 MG/ML IJ SOLN: 5.0000 mg | Freq: Three times a day (TID) | INTRAMUSCULAR | Status: DC | PRN

## 2019-02-14 MED ORDER — KCL IN DEXTROSE-NACL 40-5-0.45 MEQ/L-%-% IV SOLN
INTRAVENOUS | Status: AC
  Administered 2019-02-14 – 2019-02-15 (×2): via INTRAVENOUS
  Filled 2019-02-14 (×2): qty 1000

## 2019-02-14 MED ORDER — MENTHOL 3 MG MT LOZG: 1.0000 | LOZENGE | OROMUCOSAL | Status: DC | PRN

## 2019-02-14 MED ORDER — OXYCODONE HCL 5 MG PO TABS
10.0000 mg | ORAL_TABLET | ORAL | Status: DC | PRN
  Administered 2019-02-14: 10 mg via ORAL
  Administered 2019-02-14: 5 mg via ORAL
  Administered 2019-02-15 – 2019-02-16 (×4): 15 mg via ORAL
  Filled 2019-02-14 (×4): qty 3

## 2019-02-14 MED ORDER — BUPIVACAINE HCL (PF) 0.25 % IJ SOLN
INTRAMUSCULAR | Status: AC
  Filled 2019-02-14: qty 60

## 2019-02-14 MED ORDER — PANTOPRAZOLE SODIUM 40 MG PO TBEC
40.0000 mg | DELAYED_RELEASE_TABLET | Freq: Every day | ORAL | Status: DC
  Administered 2019-02-15 – 2019-02-17 (×3): 40 mg via ORAL
  Filled 2019-02-14 (×3): qty 1

## 2019-02-14 MED ORDER — ONDANSETRON HCL 4 MG/2ML IJ SOLN
INTRAMUSCULAR | Status: AC
  Filled 2019-02-14: qty 2

## 2019-02-14 MED ORDER — METHOCARBAMOL 500 MG IVPB - SIMPLE MED
INTRAVENOUS | Status: AC
  Filled 2019-02-14: qty 50

## 2019-02-14 MED ORDER — ASPIRIN 81 MG PO CHEW
81.0000 mg | CHEWABLE_TABLET | Freq: Two times a day (BID) | ORAL | Status: DC
  Administered 2019-02-14 – 2019-02-17 (×6): 81 mg via ORAL
  Filled 2019-02-14 (×6): qty 1

## 2019-02-14 MED ORDER — HYDROCHLOROTHIAZIDE 25 MG PO TABS
25.0000 mg | ORAL_TABLET | Freq: Every day | ORAL | Status: DC
  Administered 2019-02-15 – 2019-02-17 (×2): 25 mg via ORAL
  Filled 2019-02-14 (×3): qty 1

## 2019-02-14 MED ORDER — LACTATED RINGERS IV SOLN
INTRAVENOUS | Status: DC
  Administered 2019-02-14 (×2): via INTRAVENOUS

## 2019-02-14 MED ORDER — PROMETHAZINE HCL 25 MG/ML IJ SOLN: 6.2500 mg | INTRAMUSCULAR | Status: DC | PRN

## 2019-02-14 MED ORDER — BISACODYL 5 MG PO TBEC: 5.0000 mg | DELAYED_RELEASE_TABLET | Freq: Every day | ORAL | Status: DC | PRN

## 2019-02-14 MED ORDER — ALUM & MAG HYDROXIDE-SIMETH 200-200-20 MG/5ML PO SUSP: 30.0000 mL | ORAL | Status: DC | PRN

## 2019-02-14 MED ORDER — MIDAZOLAM HCL 2 MG/2ML IJ SOLN
INTRAMUSCULAR | Status: AC
  Filled 2019-02-14: qty 2

## 2019-02-14 MED ORDER — MAGNESIUM CITRATE PO SOLN: 1.0000 | Freq: Once | ORAL | Status: DC | PRN

## 2019-02-14 MED ORDER — MIDAZOLAM HCL 5 MG/5ML IJ SOLN
INTRAMUSCULAR | Status: DC | PRN
  Administered 2019-02-14 (×2): 1 mg via INTRAVENOUS

## 2019-02-14 MED ORDER — DOCUSATE SODIUM 100 MG PO CAPS
100.0000 mg | ORAL_CAPSULE | Freq: Two times a day (BID) | ORAL | Status: DC
  Administered 2019-02-14 – 2019-02-17 (×6): 100 mg via ORAL
  Filled 2019-02-14 (×6): qty 1

## 2019-02-14 MED ORDER — ALBUTEROL SULFATE (2.5 MG/3ML) 0.083% IN NEBU: 2.5000 mg | INHALATION_SOLUTION | Freq: Four times a day (QID) | RESPIRATORY_TRACT | Status: DC | PRN

## 2019-02-14 MED ORDER — DIPHENHYDRAMINE HCL 12.5 MG/5ML PO ELIX: 12.5000 mg | ORAL_SOLUTION | ORAL | Status: DC | PRN

## 2019-02-14 MED ORDER — PHENYLEPHRINE HCL-NACL 10-0.9 MG/250ML-% IV SOLN
INTRAVENOUS | Status: AC
  Filled 2019-02-14: qty 250

## 2019-02-14 MED ORDER — TRANEXAMIC ACID-NACL 1000-0.7 MG/100ML-% IV SOLN
1000.0000 mg | INTRAVENOUS | Status: AC
  Administered 2019-02-14: 1000 mg via INTRAVENOUS
  Filled 2019-02-14: qty 100

## 2019-02-14 MED ORDER — ACETAMINOPHEN 325 MG PO TABS: 325.0000 mg | ORAL_TABLET | Freq: Four times a day (QID) | ORAL | Status: DC | PRN

## 2019-02-14 MED ORDER — BUPIVACAINE-EPINEPHRINE 0.25% -1:200000 IJ SOLN
INTRAMUSCULAR | Status: DC | PRN
  Administered 2019-02-14: 40 mL

## 2019-02-14 MED ORDER — RISAQUAD PO CAPS
1.0000 | ORAL_CAPSULE | Freq: Every day | ORAL | Status: DC
  Administered 2019-02-14 – 2019-02-17 (×4): 1 via ORAL
  Filled 2019-02-14 (×4): qty 1

## 2019-02-14 MED ORDER — FENTANYL CITRATE (PF) 100 MCG/2ML IJ SOLN
INTRAMUSCULAR | Status: DC | PRN
  Administered 2019-02-14 (×2): 50 ug via INTRAVENOUS

## 2019-02-14 MED ORDER — PROPOFOL 500 MG/50ML IV EMUL
INTRAVENOUS | Status: DC | PRN
  Administered 2019-02-14: 100 ug/kg/min via INTRAVENOUS

## 2019-02-14 MED ORDER — POLYETHYLENE GLYCOL 3350 17 G PO PACK: 17.0000 g | PACK | Freq: Every day | ORAL | Status: DC | PRN

## 2019-02-14 MED ORDER — METHOCARBAMOL 500 MG PO TABS
500.0000 mg | ORAL_TABLET | Freq: Four times a day (QID) | ORAL | Status: DC | PRN
  Administered 2019-02-14 – 2019-02-15 (×3): 500 mg via ORAL
  Filled 2019-02-14 (×3): qty 1

## 2019-02-14 MED ORDER — SODIUM CHLORIDE 0.9 % IV SOLN
INTRAVENOUS | Status: AC
  Filled 2019-02-14: qty 500000

## 2019-02-14 MED ORDER — SODIUM CHLORIDE 0.9 % IR SOLN
Status: DC | PRN
  Administered 2019-02-14: 1000 mL

## 2019-02-14 MED ORDER — HYDROMORPHONE HCL 1 MG/ML IJ SOLN
0.2500 mg | INTRAMUSCULAR | Status: DC | PRN
  Administered 2019-02-14: 0.5 mg via INTRAVENOUS

## 2019-02-14 MED ORDER — HYDROMORPHONE HCL 1 MG/ML IJ SOLN
INTRAMUSCULAR | Status: AC
  Filled 2019-02-14: qty 1

## 2019-02-14 MED ORDER — ACETAMINOPHEN 500 MG PO TABS
1000.0000 mg | ORAL_TABLET | Freq: Four times a day (QID) | ORAL | Status: AC
  Administered 2019-02-14 – 2019-02-15 (×4): 1000 mg via ORAL
  Filled 2019-02-14 (×4): qty 2

## 2019-02-14 MED ORDER — METHOCARBAMOL 500 MG IVPB - SIMPLE MED
500.0000 mg | Freq: Four times a day (QID) | INTRAVENOUS | Status: DC | PRN
  Administered 2019-02-14: 500 mg via INTRAVENOUS
  Filled 2019-02-14: qty 50

## 2019-02-14 MED ORDER — FENTANYL CITRATE (PF) 100 MCG/2ML IJ SOLN
INTRAMUSCULAR | Status: AC
  Filled 2019-02-14: qty 2

## 2019-02-14 MED ORDER — METOCLOPRAMIDE HCL 5 MG PO TABS: 5.0000 mg | ORAL_TABLET | Freq: Three times a day (TID) | ORAL | Status: DC | PRN

## 2019-02-14 MED ORDER — MEPERIDINE HCL 50 MG/ML IJ SOLN: 6.2500 mg | INTRAMUSCULAR | Status: DC | PRN

## 2019-02-14 SURGICAL SUPPLY — 79 items
ATTUNE MED DOME PAT 41 KNEE (Knees) ×1 IMPLANT
ATTUNE PS FEM LT SZ 5 CEM KNEE (Femur) ×1 IMPLANT
ATTUNE PSRP INSR SZ5 6 KNEE (Insert) ×1 IMPLANT
BAG DECANTER FOR FLEXI CONT (MISCELLANEOUS) ×2 IMPLANT
BAG ZIPLOCK 12X15 (MISCELLANEOUS) IMPLANT
BANDAGE ACE 4X5 VEL STRL LF (GAUZE/BANDAGES/DRESSINGS) ×2 IMPLANT
BANDAGE ACE 6X5 VEL STRL LF (GAUZE/BANDAGES/DRESSINGS) ×2 IMPLANT
BANDAGE ELASTIC 6 VELCRO ST LF (GAUZE/BANDAGES/DRESSINGS) ×1 IMPLANT
BASE TIBIA ATTUNE KNEE SYS SZ6 (Knees) IMPLANT
BLADE SAG 18X100X1.27 (BLADE) ×3 IMPLANT
BLADE SAW SGTL 11.0X1.19X90.0M (BLADE) ×3 IMPLANT
BLADE SAW SGTL 13.0X1.19X90.0M (BLADE) ×2 IMPLANT
BLADE SURG SZ10 CARB STEEL (BLADE) ×4 IMPLANT
CEMENT HV SMART SET (Cement) ×4 IMPLANT
CHLORAPREP W/TINT 26 (MISCELLANEOUS) ×2 IMPLANT
COVER SURGICAL LIGHT HANDLE (MISCELLANEOUS) ×2 IMPLANT
COVER WAND RF STERILE (DRAPES) IMPLANT
CUFF TOURN SGL QUICK 34 (TOURNIQUET CUFF) ×1
CUFF TRNQT CYL 34X4.125X (TOURNIQUET CUFF) ×1 IMPLANT
DECANTER SPIKE VIAL GLASS SM (MISCELLANEOUS) ×2 IMPLANT
DRAPE INCISE IOBAN 66X45 STRL (DRAPES) IMPLANT
DRAPE ORTHO SPLIT 77X108 STRL (DRAPES) ×2
DRAPE SHEET LG 3/4 BI-LAMINATE (DRAPES) ×4 IMPLANT
DRAPE SURG ORHT 6 SPLT 77X108 (DRAPES) ×2 IMPLANT
DRAPE U-SHAPE 47X51 STRL (DRAPES) ×2 IMPLANT
DRSG AQUACEL AG ADV 3.5X10 (GAUZE/BANDAGES/DRESSINGS) ×1 IMPLANT
DRSG TEGADERM 4X4.75 (GAUZE/BANDAGES/DRESSINGS) ×1 IMPLANT
ELECT BLADE TIP CTD 4 INCH (ELECTRODE) ×2 IMPLANT
ELECT REM PT RETURN 15FT ADLT (MISCELLANEOUS) ×2 IMPLANT
EVACUATOR 1/8 PVC DRAIN (DRAIN) IMPLANT
GAUZE SPONGE 2X2 8PLY STRL LF (GAUZE/BANDAGES/DRESSINGS) IMPLANT
GLOVE BIOGEL PI IND STRL 7.5 (GLOVE) ×1 IMPLANT
GLOVE BIOGEL PI IND STRL 8 (GLOVE) ×1 IMPLANT
GLOVE BIOGEL PI INDICATOR 7.5 (GLOVE) ×1
GLOVE BIOGEL PI INDICATOR 8 (GLOVE) ×1
GLOVE SURG SS PI 7.5 STRL IVOR (GLOVE) ×4 IMPLANT
GLOVE SURG SS PI 8.0 STRL IVOR (GLOVE) ×4 IMPLANT
GOWN STRL REUS W/TWL XL LVL3 (GOWN DISPOSABLE) ×4 IMPLANT
HANDPIECE INTERPULSE COAX TIP (DISPOSABLE) ×1
HEMOSTAT SPONGE AVITENE ULTRA (HEMOSTASIS) ×3 IMPLANT
HOLDER FOLEY CATH W/STRAP (MISCELLANEOUS) IMPLANT
IMMOBILIZER KNEE 20 (SOFTGOODS) ×3 IMPLANT
IMMOBILIZER KNEE 20 THIGH 36 (SOFTGOODS) ×1 IMPLANT
KIT TURNOVER KIT A (KITS) IMPLANT
MANIFOLD NEPTUNE II (INSTRUMENTS) ×2 IMPLANT
NDL SAFETY ECLIPSE 18X1.5 (NEEDLE) IMPLANT
NEEDLE HYPO 18GX1.5 SHARP (NEEDLE)
NS IRRIG 1000ML POUR BTL (IV SOLUTION) IMPLANT
PACK TOTAL KNEE CUSTOM (KITS) ×2 IMPLANT
PIN STEINMAN FIXATION KNEE (PIN) ×1 IMPLANT
PIN THREADED HEADED SIGMA (PIN) ×1 IMPLANT
PROTECTOR NERVE ULNAR (MISCELLANEOUS) ×2 IMPLANT
SEALER BIPOLAR AQUA 6.0 (INSTRUMENTS) IMPLANT
SET HNDPC FAN SPRY TIP SCT (DISPOSABLE) ×1 IMPLANT
SPONGE GAUZE 2X2 STER 10/PKG (GAUZE/BANDAGES/DRESSINGS) ×1
SPONGE SURGIFOAM ABS GEL 100 (HEMOSTASIS) IMPLANT
STAPLER VISISTAT (STAPLE) IMPLANT
STRIP CLOSURE SKIN 1/2X4 (GAUZE/BANDAGES/DRESSINGS) IMPLANT
SUT BONE WAX W31G (SUTURE) IMPLANT
SUT MNCRL AB 4-0 PS2 18 (SUTURE) IMPLANT
SUT STRATAFIX 0 PDS 27 VIOLET (SUTURE) ×2
SUT STRATAFIX 1PDS 45CM VIOLET (SUTURE) ×1 IMPLANT
SUT VIC AB 1 CT1 27 (SUTURE) ×5
SUT VIC AB 1 CT1 27XBRD ANTBC (SUTURE) ×2 IMPLANT
SUT VIC AB 1 CTX 36 (SUTURE)
SUT VIC AB 1 CTX36XBRD ANBCTR (SUTURE) IMPLANT
SUT VIC AB 2-0 CT1 27 (SUTURE) ×3
SUT VIC AB 2-0 CT1 TAPERPNT 27 (SUTURE) ×3 IMPLANT
SUTURE STRATFX 0 PDS 27 VIOLET (SUTURE) ×1 IMPLANT
SYR 3ML LL SCALE MARK (SYRINGE) IMPLANT
SYR 50ML LL SCALE MARK (SYRINGE) IMPLANT
TAPE STRIPS DRAPE STRL (GAUZE/BANDAGES/DRESSINGS) ×1 IMPLANT
TIBIA ATTUNE KNEE SYS BASE SZ6 (Knees) ×2 IMPLANT
TOWER CARTRIDGE SMART MIX (DISPOSABLE) ×4 IMPLANT
TRAY FOLEY MTR SLVR 16FR STAT (SET/KITS/TRAYS/PACK) ×2 IMPLANT
WATER STERILE IRR 1000ML POUR (IV SOLUTION) ×2 IMPLANT
WIPE CHG CHLORHEXIDINE 2% (PERSONAL CARE ITEMS) ×2 IMPLANT
WRAP KNEE MAXI GEL POST OP (GAUZE/BANDAGES/DRESSINGS) ×2 IMPLANT
YANKAUER SUCT BULB TIP 10FT TU (MISCELLANEOUS) ×2 IMPLANT

## 2019-02-14 NOTE — Brief Op Note (Signed)
02/14/2019  10:48 AM  PATIENT:  Margaret Cisneros  68 y.o. female  PRE-OPERATIVE DIAGNOSIS:  Degenerative joint disease left knee  POST-OPERATIVE DIAGNOSIS:  Left total knee arthroplasty  PROCEDURE:  Procedure(s) with comments: TOTAL KNEE ARTHROPLASTY (Left) - 150 mins  SURGEON:  Surgeon(s) and Role:    Susa Day, MD - Primary  PHYSICIAN ASSISTANT:   ASSISTANTS: [place]   ANESTHESIA:   general  EBL:  50 mL   BLOOD ADMINISTERED:none  DRAINS: none   LOCAL MEDICATIONS USED:  MARCAINE     SPECIMEN:  No Specimen  DISPOSITION OF SPECIMEN:  PATHOLOGY  COUNTS:  YES  TOURNIQUET:   Total Tourniquet Time Documented: Thigh (Left) - 75 minutes Total: Thigh (Left) - 75 minutes   DICTATION: .Other Dictation: Dictation Number [id number]  PLAN OF CARE: Admit to inpatient   PATIENT DISPOSITION:  PACU - hemodynamically stable.   Delay start of Pharmacological VTE agent (>24hrs) due to surgical blood loss or risk of bleeding: no

## 2019-02-14 NOTE — Anesthesia Procedure Notes (Signed)
Procedure Name: MAC Date/Time: 02/14/2019 8:28 AM Performed by: Maxwell Caul, CRNA Pre-anesthesia Checklist: Patient identified, Emergency Drugs available, Suction available and Patient being monitored Oxygen Delivery Method: Simple face mask

## 2019-02-14 NOTE — Anesthesia Postprocedure Evaluation (Signed)
Anesthesia Post Note  Patient: Margaret Cisneros  Procedure(s) Performed: TOTAL KNEE ARTHROPLASTY (Left )     Patient location during evaluation: PACU Anesthesia Type: Spinal Level of consciousness: awake and alert, oriented and patient cooperative Pain management: pain level controlled Vital Signs Assessment: post-procedure vital signs reviewed and stable Respiratory status: spontaneous breathing, nonlabored ventilation and respiratory function stable Cardiovascular status: blood pressure returned to baseline and stable Postop Assessment: spinal receding and no apparent nausea or vomiting Anesthetic complications: no    Last Vitals:  Vitals:   02/14/19 1315 02/14/19 1327  BP:  (!) 144/79  Pulse:  79  Resp: 17 16  Temp:  36.7 C  SpO2:  100%    Last Pain:  Vitals:   02/14/19 1327  TempSrc: Oral  PainSc:                  Yalonda Sample,E. Pualani Borah

## 2019-02-14 NOTE — Transfer of Care (Signed)
Immediate Anesthesia Transfer of Care Note  Patient: Margaret Cisneros  Procedure(s) Performed: TOTAL KNEE ARTHROPLASTY (Left )  Patient Location: PACU  Anesthesia Type:Spinal  Level of Consciousness: awake, alert  and oriented  Airway & Oxygen Therapy: Patient Spontanous Breathing and Patient connected to face mask oxygen  Post-op Assessment: Report given to RN and Post -op Vital signs reviewed and stable  Post vital signs: Reviewed and stable  Last Vitals:  Vitals Value Taken Time  BP 126/90 02/14/2019 11:21 AM  Temp    Pulse 90 02/14/2019 11:22 AM  Resp 18 02/14/2019 11:22 AM  SpO2 99 % 02/14/2019 11:22 AM  Vitals shown include unvalidated device data.  Last Pain:  Vitals:   02/14/19 0637  TempSrc:   PainSc: 4       Patients Stated Pain Goal: 6 (43/83/81 8403)  Complications: No apparent anesthesia complications

## 2019-02-14 NOTE — Anesthesia Procedure Notes (Signed)
Anesthesia Regional Block: Adductor canal block   Pre-Anesthetic Checklist: ,, timeout performed, Correct Patient, Correct Site, Correct Laterality, Correct Procedure, Correct Position, site marked, Risks and benefits discussed,  Surgical consent,  Pre-op evaluation,  At surgeon's request and post-op pain management  Laterality: Left and Lower  Prep: chloraprep       Needles:  Injection technique: Single-shot  Needle Type: Echogenic Needle     Needle Length: 9cm  Needle Gauge: 21     Additional Needles:   Procedures:,,,, ultrasound used (permanent image in chart),,,,  Narrative:  Start time: 02/14/2019 8:05 AM End time: 02/14/2019 8:11 AM Injection made incrementally with aspirations every 5 mL.  Performed by: Personally  Anesthesiologist: Annye Asa, MD  Additional Notes: Pt identified in Holding room.  Monitors applied. Working IV access confirmed. Sterile prep L thigh.  #21ga ECHOgenic needle into adductor canal with US guidance.  20cc 0.5% Bupivacaine with 1:200k epi injected incrementally after negative test dose.  Patient asymptomatic, VSS, no heme aspirated, tolerated well.  Jenita Seashore, MD

## 2019-02-14 NOTE — Interval H&P Note (Signed)
History and Physical Interval Note:  02/14/2019 8:05 AM  Margaret Cisneros  has presented today for surgery, with the diagnosis of Degenerative joint disease left knee.  The various methods of treatment have been discussed with the patient and family. After consideration of risks, benefits and other options for treatment, the patient has consented to  Procedure(s) with comments: TOTAL KNEE ARTHROPLASTY (Left) - 150 mins as a surgical intervention.  The patient's history has been reviewed, patient examined, no change in status, stable for surgery.  I have reviewed the patient's chart and labs.  Questions were answered to the patient's satisfaction.     Margaret Cisneros

## 2019-02-14 NOTE — Interval H&P Note (Signed)
History and Physical Interval Note:  02/14/2019 8:05 AM  Margaret Cisneros  has presented today for surgery, with the diagnosis of Degenerative joint disease left knee.  The various methods of treatment have been discussed with the patient and family. After consideration of risks, benefits and other options for treatment, the patient has consented to  Procedure(s) with comments: TOTAL KNEE ARTHROPLASTY (Left) - 150 mins as a surgical intervention.  The patient's history has been reviewed, patient examined, no change in status, stable for surgery.  I have reviewed the patient's chart and labs.  Questions were answered to the patient's satisfaction.     Johnn Hai

## 2019-02-14 NOTE — Plan of Care (Signed)

## 2019-02-14 NOTE — Discharge Instructions (Signed)

## 2019-02-14 NOTE — Op Note (Signed)
NAME: Margaret Cisneros, H[place] MEDICAL RECORD [id number] ACCOUNT [id number] DATE OF BIRTH:[birth year] FACILITY: [facility] LOCATION: [facility] PHYSICIAN:Damesha Lawler Windy Kalata, MD  OPERATIVE REPORT  DATE OF PROCEDURE:  02/14/2019  PREOPERATIVE DIAGNOSIS:  End-stage osteoarthritis of the left knee.  POSTOPERATIVE DIAGNOSIS:  End-stage osteoarthritis of the left knee.  PROCEDURE PERFORMED:  Left total knee arthroplasty utilizing DePuy Attune, rotating platform, 5 femur, 6 tibia, 6 mm insert, 41 patella.  ANESTHESIA:  General.  ASSISTANT:  Lacie Draft, PA.  ANESTHESIA:  Spinal.  HISTORY:  End-stage osteoarthrosis, bone-on-bone, left knee, refractory to conservative treatment, indicated for replacement of the degenerated joint.  Risks and benefits discussed including bleeding, infection, damage to neurovascular structure,  suboptimal range of motion, DVT, PE, anesthetic complication, need for revision in the future, etc.  TECHNIQUE:  With the patient in supine position.  After induction of adequate spinal anesthesia, left lower extremity was prepped, draped, and exsanguinated in usual sterile fashion.  Thigh tourniquet inflated to 250 mmHg.  Midline incision was then made  over the patella.  Full thickness flaps developed.  Median parapatellar arthrotomy performed.  Patella everted, knee flexed.  Tricompartmental osteoarthrosis, bone-on-bone was noted.  Multiple osteophytes which were removed with a rongeur.  Remnants of  medial and lateral menisci were removed.  Cauterized geniculates.  Step drill utilized and the femoral canal was irrigated.  A 5 degree left was utilized and inserted gently as  patient had an ipsilateral total hip.  No obstruction was noted.  The 10 off the distal femur due to the flexion contracture.  This was then pinned and performed a distal femoral cutting guide.  This was then incised off the anterior cortex.  After this cut was performed actually distal over a 5.  A 6 did not  capture  any resection off the femoral condyles.  She did have some slight internal rotation of the femur.  Anterior, posterior chamfer cuts were then performed with the soft tissues protected posteriorly at all times.  I did not notch the femur.  Following this,  we then subluxed the tibia.  External alignment guide 2 initially off the defect and finally 4 off the defect which was medial to get an area of sclerotic bone that involved half of the medial tibial plateau.  This was pinned and we performed our cut,  checking posteriorly at all times to protect the popliteus.  A total of 3 and 4 were taken medially which was 10 laterally.  I drilled holes in the remaining sclerotic bone medially.  This was then sized to a maximally to a 6 just the medial aspect of  the tibial tubercle.  This was pinned to harvested bone from the proximal tibia.  Placed it inside the femoral canal and then centrally drilled it using our punch guide.  I then turned our attention back towards the femur bisecting the canal.  We used  our box cut jig.  This cut was then performed.  I placed a trial femur and drilled our lug holes.  I placed a 5 insert, reduced it and had full extension, full flexion, good stability to varus valgus stress in 0 to 30 degrees.  Negative anterior drawer.   I then everted the patella.  It was severely osteoarthritic with bone-on-bone.  Osteophytes were removed with a rongeur.  Measured external alignment guide, took a 7.5 off the thickness from 20 to a 15.  This was then planed appropriately, measured to a  41 medializing the patellar drill holes.  They  were then drilled with a paddle parallel to the articular surface.  I then reduced it and had excellent patellofemoral tracking.  I then removed all instrumentation and checked posteriorly.  There were  small osteophytes removed with an osteotome protecting posteriorly with a curved Crego.  Cauterized the geniculates.  Popliteus and capsule was intact.  I  used pulsatile lavage to copiously irrigate the wound.  I then flexed the knee, dried all surfaces  thoroughly.  Mixed cement on the back table in the appropriate fashion, injected into the tibial canal digitally pressurizing it.  I then cemented the tibial tray with cement on both sides, impacted it.  Redundant cement removed.  I cemented and impacted  the femur.  Redundant cement removed.  I placed a trial 6 mm insert, reduced it to extension and held in axial load throughout the curing of the cement.  Redundant cement removed.  I cemented and impacted the patella.  I injected 0.25% Marcaine with  epinephrine in the periosteal tissues.  Placed the antibiotic irrigation in the wound and allowing for the curing of the cement.  After curing of the cement 71 minutes, I then deflated the tourniquet and any bleeding, which was minimal, was cauterized.   We then flexed the knee and meticulously removed all redundant cement.  I removed that 6 insert, checked posteriorly, irrigated copiously.  Flexed the knee, subluxed it.  Used a 6 mm permanent insert, reduced it and had full extension, full flexion, good  stability with varus valgus stress in 0 to 30 degrees.  Negative anterior drawer.  Next, with the knee in slight flexion, I repaired the patellar arthrotomy with #1 Vicryl interrupted figure-of-eight sutures and oversewn with a running Stratafix and  then, following this, I had excellent patellofemoral tracking as well.  Copiously irrigated the wound.  Subcutaneous with 2-0 and skin with Monocryl.  Sterile dressing applied and flexion to gravity at 90 degrees.  Mobilizer was placed.  She was then  transported to the recovery room in satisfactory condition.  The patient tolerated the procedure well.  No complications.   Assistant Lacie Draft, [place].    Minimal blood loss.  AN/NUANCE  D:02/14/2019 T:02/14/2019 JOB:[id number]

## 2019-02-14 NOTE — Evaluation (Signed)
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  Pt should progress to dc to sister's home with family assist.    Follow Up Recommendations Follow surgeon's recommendation for DC plan and follow-up therapies    Equipment Recommendations  None recommended by PT    Recommendations for Other Services       Precautions / Restrictions Precautions Precautions: Fall;Knee Required Braces or Orthoses: Knee Immobilizer - Right Knee Immobilizer - Right: Discontinue once straight leg raise with < 10 degree lag Restrictions Weight Bearing Restrictions: No Other Position/Activity Restrictions: WBAT      Mobility  Bed Mobility Overal bed mobility: Needs Assistance Bed Mobility: Supine to Sit;Sit to Supine     Supine to sit: Min assist Sit to supine: Min assist   General bed mobility comments: cues for sequence and use of R LE to self assist  Transfers Overall transfer level: Needs assistance   Transfers: Sit to/from Stand Sit to Stand: Min assist         General transfer comment: cues for LE management and use of UEs to self assist  Ambulation/Gait Ambulation/Gait assistance: Min assist Gait Distance (Feet): 38 Feet Assistive device: Rolling walker (2 wheeled) Gait Pattern/deviations: Step-to pattern;Decreased step length - right;Decreased step length - left;Shuffle;Trunk flexed Gait velocity: decr   General Gait Details: cues for sequence, posture and position from [employer]            Wheelchair Mobility    Modified Rankin (Stroke Patients Only)       Balance Overall balance assessment: Mild deficits observed, not formally tested                                            Pertinent Vitals/Pain Pain Assessment: 0-10 Pain Score: 6  Pain Location: L knee Pain Descriptors / Indicators: Aching;Sore Pain Intervention(s): Limited activity within patient's tolerance;Monitored during session;Premedicated before session;Ice applied    Home Living Family/patient expects to be discharged to:: Private residence Living Arrangements: Alone Available Help at Discharge: Family Type of Home: House Home Access: Stairs to enter Entrance Stairs-Rails: Psychiatric nurse of Steps: 2 Home Layout: One level Home Equipment: Environmental consultant - 2 wheels;Cane - single point;Bedside commode Additional Comments: Pt going to stay with sister - information above relates to sister's home    Prior Function Level of Independence: Independent               Hand Dominance        Extremity/Trunk Assessment   Upper Extremity Assessment Upper Extremity Assessment: Overall WFL for tasks assessed    Lower Extremity Assessment Lower Extremity Assessment: LLE deficits/detail    Cervical / Trunk Assessment Cervical / Trunk Assessment: Normal  Communication   Communication: No difficulties  Cognition Arousal/Alertness: Awake/alert Behavior During Therapy: WFL for tasks assessed/performed Overall Cognitive Status: Within Functional Limits for tasks assessed                                        General Comments  Exercises     Assessment/Plan    PT Assessment Patient needs continued PT services  PT Problem List Decreased strength;Decreased range of motion;Decreased activity tolerance;Decreased mobility;Pain;Obesity;Decreased knowledge of use of DME       PT Treatment Interventions DME instruction;Gait training;Stair training;Functional mobility training;Therapeutic exercise;Therapeutic activities;Patient/family education    PT Goals (Current goals can be found in the Care Plan section)  Acute Rehab PT Goals Patient Stated Goal:  Regain IND PT Goal Formulation: With patient Time For Goal Achievement: 02/21/19 Potential to Achieve Goals: Good    Frequency 7X/week   Barriers to discharge        Co-evaluation               AM-PAC PT "6 Clicks" Mobility  Outcome Measure Help needed turning from your back to your side while in a flat bed without using bedrails?: A Little Help needed moving from lying on your back to sitting on the side of a flat bed without using bedrails?: A Little Help needed moving to and from a bed to a chair (including a wheelchair)?: A Little Help needed standing up from a chair using your arms (e.g., wheelchair or bedside chair)?: A Little Help needed to walk in hospital room?: A Little Help needed climbing 3-5 steps with a railing? : A Lot 6 Click Score: 17    End of Session Equipment Utilized During Treatment: Gait belt;Left knee immobilizer Activity Tolerance: Patient tolerated treatment well Patient left: in bed;with call bell/phone within reach;with bed alarm set;with nursing/sitter in room Nurse Communication: Mobility status PT Visit Diagnosis: Difficulty in walking, not elsewhere classified (R26.2)    Time: 5397-6734 PT Time Calculation (min) (ACUTE ONLY): 32 min   Charges:   PT Evaluation $PT Eval Low Complexity: 1 Low PT Treatments $Gait Training: 8-22 mins        [place] Pager [phone] Office [phone]   Katrinna Travieso 02/14/2019, 4:36 PM

## 2019-02-14 NOTE — Anesthesia Procedure Notes (Signed)
Spinal  Patient location during procedure: OR End time: 02/14/2019 8:40 AM Staffing Anesthesiologist: Annye Asa, MD Performed: anesthesiologist  Preanesthetic Checklist Completed: patient identified, site marked, surgical consent, pre-op evaluation, timeout performed, IV checked, risks and benefits discussed and monitors and equipment checked Spinal Block Patient position: sitting Prep: site prepped and draped and DuraPrep Patient monitoring: blood pressure, continuous pulse ox, cardiac monitor and heart rate Approach: midline Location: L2-3 Injection technique: single-shot Needle Needle type: Pencan  Needle gauge: 24 G Needle length: 9 cm Additional Notes Pt identified in Operating room.  Monitors applied. Working IV access confirmed. Sterile prep, drape lumbar spine.  After 1% lido local L 3,4 by CRNA, and #24ga Pencan unsuccessful,  local L2,3 and #24ga Pencan into clear CSF.  15mg  0.75% Bupivacaine with dextrose injected with asp CSF beginning and end of injection.  Patient asymptomatic, VSS, no heme aspirated, tolerated well.  Jenita Seashore, MD

## 2019-02-15 DIAGNOSIS — M1712 Unilateral primary osteoarthritis, left knee: Secondary | ICD-10-CM | POA: Diagnosis not present

## 2019-02-15 LAB — BASIC METABOLIC PANEL
Anion gap: 8 (ref 5–15)
BUN: 8 mg/dL (ref 8–23)
CO2: 30 mmol/L (ref 22–32)
Calcium: 8.5 mg/dL — ABNORMAL LOW (ref 8.9–10.3)
Chloride: 97 mmol/L — ABNORMAL LOW (ref 98–111)
Creatinine, Ser: 0.78 mg/dL (ref 0.44–1.00)
GFR calc Af Amer: >60 mL/min (ref >60)
GFR calc non Af Amer: >60 mL/min (ref >60)
Glucose, Bld: 144 mg/dL — ABNORMAL HIGH (ref 70–99)
Potassium: 3.1 mmol/L — ABNORMAL LOW (ref 3.5–5.1)
SODIUM: 135 mmol/L (ref 135–145)

## 2019-02-15 LAB — CBC
HCT: 40.5 % (ref 36.0–46.0)
Hemoglobin: 12.6 g/dL (ref 12.0–15.0)
MCH: 26.5 pg (ref 26.0–34.0)
MCHC: 31.1 g/dL (ref 30.0–36.0)
MCV: 85.1 fL (ref 80.0–100.0)
Platelets: 210 10*3/uL (ref 150–400)
RBC: 4.76 MIL/uL (ref 3.87–5.11)
RDW: 15.9 % — ABNORMAL HIGH (ref 11.5–15.5)
WBC: 9.9 10*3/uL (ref 4.0–10.5)
nRBC: 0 % (ref 0.0–0.2)

## 2019-02-15 MED ORDER — ONDANSETRON HCL 4 MG PO TABS: 4.0000 mg | ORAL_TABLET | Freq: Four times a day (QID) | ORAL | 0 refills | Status: DC | PRN

## 2019-02-15 MED ORDER — POTASSIUM CHLORIDE CRYS ER 20 MEQ PO TBCR
20.0000 meq | EXTENDED_RELEASE_TABLET | Freq: Every day | ORAL | Status: DC
  Administered 2019-02-15 – 2019-02-16 (×2): 20 meq via ORAL
  Filled 2019-02-15 (×2): qty 1

## 2019-02-15 MED ORDER — METHOCARBAMOL 500 MG PO TABS: 500.0000 mg | ORAL_TABLET | Freq: Four times a day (QID) | ORAL | 1 refills | Status: DC | PRN

## 2019-02-15 MED ORDER — ASPIRIN 81 MG PO CHEW: 81.0000 mg | CHEWABLE_TABLET | Freq: Two times a day (BID) | ORAL | 1 refills | Status: DC

## 2019-02-15 MED ORDER — POLYETHYLENE GLYCOL 3350 17 G PO PACK: 17.0000 g | PACK | Freq: Every day | ORAL | 0 refills | Status: DC | PRN

## 2019-02-15 MED ORDER — DOCUSATE SODIUM 100 MG PO CAPS: 100.0000 mg | ORAL_CAPSULE | Freq: Two times a day (BID) | ORAL | 0 refills | Status: DC

## 2019-02-15 MED ORDER — OXYCODONE HCL 5 MG PO TABS: 5.0000 mg | ORAL_TABLET | ORAL | 0 refills | Status: DC | PRN

## 2019-02-15 NOTE — Plan of Care (Signed)
  Problem: Activity: Goal: Risk for activity intolerance will decrease Outcome: Progressing   Problem: Education: Goal: Knowledge of General Education information will improve Description Including pain rating scale, medication(s)/side effects and non-pharmacologic comfort measures Outcome: Progressing   Problem: Activity: Goal: Risk for activity intolerance will decrease Outcome: Progressing   Problem: Elimination: Goal: Will not experience complications related to bowel motility Outcome: Progressing   Problem: Coping: Goal: Level of anxiety will decrease Outcome: Progressing   Problem: Pain Managment: Goal: General experience of comfort will improve Outcome: Progressing

## 2019-02-15 NOTE — TOC Initial Note (Signed)
Transition of Care [facility]) - Initial/Assessment Note    Patient Details  Name: Margaret Cisneros MRN: [id number] Date of Birth: 1951-02-15  Transition of [facility]) CM/SW Contact:   Dessa Phi, RN Phone Number: 02/15/2019, 3:54 PM  Clinical Narrative:        POD#1 TKA. From home. Patient states per Orthopedist-the d/c plan of for otpt PT-has appt on Monday @ 3p-[facility]. Patient will stay w/her sister Margaret Cisneros [address] Dr. Letta Kocher [phone]. She has no DME. Will await PT/OT cons & recc.   Rissie Sculley, Juliann Pulse, RN             Expected Discharge Plan: OP Rehab Barriers to Discharge: No Barriers Identified   Patient Goals and CMS Choice Patient states their goals for this hospitalization and ongoing recovery are:: (get better)      Expected Discharge Plan and Services Expected Discharge Plan: OP Rehab Discharge Planning Services: CM Consult   Living arrangements for the past 2 months: Single Family Home                          Prior Living Arrangements/Services Living arrangements for the past 2 months: Single Family Home Lives with:: Self Patient language and need for interpreter reviewed:: Yes Do you feel safe going back to the place where you live?: Yes      Need for Family Participation in Patient Care: No (Comment) Care giver support system in place?: Yes (comment)   Criminal Activity/Legal Involvement Pertinent to Current Situation/Hospitalization: No - Comment as needed  Activities of Daily Living Home Assistive Devices/Equipment: Eyeglasses ADL Screening (condition at time of admission) Patient's cognitive ability adequate to safely complete daily activities?: Yes Is the patient deaf or have difficulty hearing?: No Does the patient have difficulty seeing, even when wearing glasses/contacts?: No Does the patient have difficulty concentrating, remembering, or making decisions?: No Patient able to express need for assistance with ADLs?: Yes Does the patient have  difficulty dressing or bathing?: No Independently performs ADLs?: Yes (appropriate for developmental age) Does the patient have difficulty walking or climbing stairs?: Yes Weakness of Legs: None Weakness of Arms/Hands: None  Permission Sought/Granted Permission sought to share information with : Case Manager Permission granted to share information with : Yes, Verbal Permission Granted              Emotional Assessment Appearance:: Appears stated age, Well-Groomed Attitude/Demeanor/Rapport: Gracious Affect (typically observed): Accepting Orientation: : Oriented to Self, Oriented to Place, Oriented to  Time, Oriented to Situation      Admission diagnosis:  Degenerative joint disease left knee Patient Active Problem List   Diagnosis Date Noted  . Osteoarthritis of left knee 02/14/2019  . Left knee DJD 02/14/2019  . Osteoarthritis of left hip 04/06/2018   PCP:  [facility] Pharmacy:   [facility] [address] [place] [address] [address] Phone: [phone] Fax: [phone]     Social Determinants of Health (SDOH) Interventions    Readmission Risk Interventions 30 Day Unplanned Readmission Risk Score     Admission (Current) from 02/14/2019 in [facility]  30 Day Unplanned Readmission Risk Score (%)  7 Filed at 02/15/2019 1200     This score is the patient's risk of an unplanned readmission within 30 days of being discharged (0 -100%). The score is based on dignosis, age, lab data, medications, orders, and past  utilization.   Low:  0-14.9   Medium: 15-21.9   High: 22-29.9   Extreme: 30 and above       No flowsheet data found.

## 2019-02-15 NOTE — Progress Notes (Signed)
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  Instructed to D/C when able to perform 10 active SLR.  Also educated on proper application and removal.  Assisted OOB to amb. General transfer comment: cues for LE management and use of UEs to self assist  also assisted with toilet transfer. Then returned to room to perform some TE's following HEP handout.  Instructed on proper tech, freq as well as use of ICE.     Follow Up Recommendations  Follow surgeon's recommendation for DC plan and follow-up therapies;Home health PT     Equipment Recommendations  None recommended by PT    Recommendations for Other Services       Precautions / Restrictions Precautions Precautions: Fall;Knee Precaution Comments: amb with KI this am Knee Immobilizer - Right: Discontinue once straight leg raise with < 10 degree lag Restrictions Weight Bearing Restrictions: No Other Position/Activity Restrictions: WBAT    Mobility  Bed Mobility Overal bed mobility: Needs Assistance Bed Mobility: Supine to Sit;Sit to Supine     Supine to sit: Min assist Sit to supine: Min assist   General bed mobility comments: cues for sequence and use of belt to self assist L LE  Transfers Overall transfer level: Needs assistance   Transfers: Sit to/from Stand Sit to Stand: Min assist         General transfer comment: cues for LE management and use of UEs to self assist  also assisted with toilet transfer  Ambulation/Gait Ambulation/Gait assistance: Min guard;Supervision Gait Distance (Feet): 45 Feet Assistive device: Rolling walker (2 wheeled) Gait Pattern/deviations: Step-to pattern;Decreased step length - right;Decreased step length - left;Shuffle;Trunk  flexed Gait velocity: decr   General Gait Details: cues for sequence, posture and position from [employer]             Wheelchair Mobility    Modified Rankin (Stroke Patients Only)       Balance                                            Cognition Arousal/Alertness: Awake/alert Behavior During Therapy: WFL for tasks assessed/performed Overall Cognitive Status: Within Functional Limits for tasks assessed                                        Exercises   Total Knee Replacement TE's 10 reps B LE ankle pumps 10 reps towel squeezes 10 reps knee presses 10 reps heel slides  10 reps SAQ's 10 reps SLR's 10 reps ABD Followed by ICE     General Comments        Pertinent Vitals/Pain Pain Assessment: 0-10 Pain Score: 6  Pain Location: L knee Pain Descriptors / Indicators: Aching;Sore Pain Intervention(s): Monitored during session;Repositioned;Patient requesting pain meds-RN notified;Ice applied    Home Living                      Prior Function            PT  Goals (current goals can now be found in the care plan section) Progress towards PT goals: Progressing toward goals    Frequency    7X/week      PT Plan Current plan remains appropriate    Co-evaluation              AM-PAC PT "6 Clicks" Mobility   Outcome Measure  Help needed turning from your back to your side while in a flat bed without using bedrails?: A Little Help needed moving from lying on your back to sitting on the side of a flat bed without using bedrails?: A Little Help needed moving to and from a bed to a chair (including a wheelchair)?: A Little Help needed standing up from a chair using your arms (e.g., wheelchair or bedside chair)?: A Little Help needed to walk in hospital room?: A Little Help needed climbing 3-5 steps with a railing? : A Lot 6 Click Score: 17    End of Session Equipment Utilized During Treatment: Gait  belt;Left knee immobilizer Activity Tolerance: Patient tolerated treatment well Patient left: in bed;with call bell/phone within reach;with bed alarm set;with nursing/sitter in room Nurse Communication: Mobility status PT Visit Diagnosis: Difficulty in walking, not elsewhere classified (R26.2)     Time: 8118-8677 PT Time Calculation (min) (ACUTE ONLY): 40 min  Charges:  $Gait Training: 8-22 mins $Therapeutic Exercise: 8-22 mins $Therapeutic Activity: 8-22 mins                     Rica Koyanagi  PTA Acute  Rehabilitation Services Pager      [phone] Office      [phone]

## 2019-02-15 NOTE — Progress Notes (Signed)
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  Assisted to bathroom.  Then returned to room to perform some TE's following HEP handout.  Instructed on proper tech, freq as well as use of ICE.   Pt plans to stay with sister.     Follow Up Recommendations  Follow surgeon's recommendation for DC plan and follow-up therapies;Home health PT     Equipment Recommendations  None recommended by PT    Recommendations for Other Services      Precautions / Restrictions Precautions Precautions: Fall;Knee Precaution Comments: amb with KI this am Knee Immobilizer - Right: Discontinue once straight leg raise with < 10 degree lag Restrictions Weight Bearing Restrictions: No Other Position/Activity Restrictions: WBAT    Mobility  Bed Mobility Overal bed mobility: Needs Assistance Bed Mobility: Supine to Sit;Sit to Supine     Supine to sit: Min assist Sit to supine: Min assist   General bed mobility comments: cues for sequence and use of belt to self assist L LE  Transfers Overall transfer level: Needs assistance   Transfers: Sit to/from Stand Sit to Stand: Min assist         General transfer comment: cues for LE management and use of UEs to self assist  also assisted with toilet transfer  Ambulation/Gait Ambulation/Gait assistance: Min guard;Supervision Gait Distance (Feet): 65 Feet Assistive device: Rolling walker (2 wheeled) Gait Pattern/deviations: Step-to pattern;Decreased step length - right;Decreased step length - left;Shuffle;Trunk flexed Gait velocity: decr   General Gait Details: cues for sequence, posture and position from [employer]              Wheelchair Mobility    Modified Rankin (Stroke Patients Only)       Balance                                            Cognition Arousal/Alertness: Awake/alert Behavior During Therapy: WFL for tasks assessed/performed Overall Cognitive Status: Within Functional Limits for tasks assessed                                        Exercises      General Comments        Pertinent Vitals/Pain Pain Assessment: 0-10 Pain Score: 6  Pain Location: L knee Pain Descriptors / Indicators: Aching;Sore Pain Intervention(s): Monitored during session;Repositioned;Patient requesting pain meds-RN notified;Ice applied    Home Living                      Prior Function            PT Goals (current goals can now be found in the care plan section) Progress towards PT goals: Progressing toward goals    Frequency    7X/week      PT Plan Current plan remains appropriate    Co-evaluation  AM-PAC PT "6 Clicks" Mobility   Outcome Measure  Help needed turning from your back to your side while in a flat bed without using bedrails?: A Little Help needed moving from lying on your back to sitting on the side of a flat bed without using bedrails?: A Little Help needed moving to and from a bed to a chair (including a wheelchair)?: A Little Help needed standing up from a chair using your arms (e.g., wheelchair or bedside chair)?: A Little Help needed to walk in hospital room?: A Little Help needed climbing 3-5 steps with a railing? : A Lot 6 Click Score: 17    End of Session Equipment Utilized During Treatment: Gait belt;Left knee immobilizer Activity Tolerance: Patient tolerated treatment well Patient left: in bed;with call bell/phone within reach;with bed alarm set;with nursing/sitter in room Nurse Communication: Mobility status PT Visit Diagnosis: Difficulty in walking, not elsewhere classified (R26.2)     Time:  2197-5883 PT Time Calculation (min) (ACUTE ONLY): 32 min  Charges:  $Gait Training: 8-22 mins  $Therapeutic Activity: 8-22 mins                     Rica Koyanagi  PTA Acute  Rehabilitation Services Pager      [phone] Office      [phone]

## 2019-02-15 NOTE — Progress Notes (Signed)
Subjective: 1 Day Post-Op Procedure(s) (LRB): TOTAL KNEE ARTHROPLASTY (Left) Patient reports pain as 3 on 0-10 scale.    Objective: Vital signs in last 24 hours: Temp:  [97.6 F (36.4 C)-98.3 F (36.8 C)] 98.3 F (36.8 C) (03/12 0542) Pulse Rate:  [37-97] 94 (03/12 0542) Resp:  [14-24] 18 (03/12 0542) BP: (110-160)/(78-118) 135/118 (03/12 0542) SpO2:  [97 %-100 %] 97 % (03/12 0542)  Intake/Output from previous day: 03/11 0701 - 03/12 0700 In: 3447.8 [P.O.:660; I.V.:2186.2; IV Piggyback:601.6] Out: 2600 [Urine:2550; Blood:50] Intake/Output this shift: Total I/O In: -  Out: 200 [Urine:200]  Recent Labs    02/15/19 0523  HGB 12.6   Recent Labs    02/15/19 0523  WBC 9.9  RBC 4.76  HCT 40.5  PLT 210   Recent Labs    02/14/19 1346 02/15/19 0523  NA 138 135  K 3.1* 3.1*  CL 96* 97*  CO2 32 30  BUN 14 8  CREATININE 0.80 0.78  GLUCOSE 105* 144*  CALCIUM 8.8* 8.5*   No results for input(s): LABPT, INR in the last 72 hours.  Neurologically intact Neurovascular intact Intact pulses distally Incision: dressing C/D/I comtps soft . No DVT.  Assessment/Plan: 1 Day Post-Op Procedure(s) (LRB): TOTAL KNEE ARTHROPLASTY (Left) Doing well. K+ 3.1 OOB    Anticipated LOS equal to or greater than 2 midnights due to - Age 22 and older with one or more of the following:  - Obesity  - Expected need for hospital services (PT, OT, Nursing) required for safe  discharge  - Anticipated need for postoperative skilled nursing care or inpatient rehab  - Active co-morbidities: None OR   - Unanticipated findings during/Post Surgery: None  - Patient is a high risk of re-admission due to: None    Margaret Cisneros 02/15/2019, 7:21 AM

## 2019-02-16 ENCOUNTER — Encounter (HOSPITAL_COMMUNITY): Payer: Self-pay | Admitting: Specialist

## 2019-02-16 DIAGNOSIS — M1712 Unilateral primary osteoarthritis, left knee: Secondary | ICD-10-CM | POA: Diagnosis not present

## 2019-02-16 LAB — BASIC METABOLIC PANEL
Anion gap: 11 (ref 5–15)
BUN: 9 mg/dL (ref 8–23)
CO2: 30 mmol/L (ref 22–32)
Calcium: 8.9 mg/dL (ref 8.9–10.3)
Chloride: 92 mmol/L — ABNORMAL LOW (ref 98–111)
Creatinine, Ser: 0.8 mg/dL (ref 0.44–1.00)
GFR calc non Af Amer: >60 mL/min (ref >60)
Glucose, Bld: 136 mg/dL — ABNORMAL HIGH (ref 70–99)
Potassium: 3 mmol/L — ABNORMAL LOW (ref 3.5–5.1)
Sodium: 133 mmol/L — ABNORMAL LOW (ref 135–145)

## 2019-02-16 LAB — CBC
HCT: 41.5 % (ref 36.0–46.0)
Hemoglobin: 12.8 g/dL (ref 12.0–15.0)
MCH: 25.9 pg — ABNORMAL LOW (ref 26.0–34.0)
MCHC: 30.8 g/dL (ref 30.0–36.0)
MCV: 84 fL (ref 80.0–100.0)
Platelets: 234 10*3/uL (ref 150–400)
RBC: 4.94 MIL/uL (ref 3.87–5.11)
RDW: 15.4 % (ref 11.5–15.5)
WBC: 13.2 10*3/uL — ABNORMAL HIGH (ref 4.0–10.5)
nRBC: 0 % (ref 0.0–0.2)

## 2019-02-16 MED ORDER — GABAPENTIN 300 MG PO CAPS: 300.0000 mg | ORAL_CAPSULE | Freq: Three times a day (TID) | ORAL | 1 refills | Status: DC | PRN

## 2019-02-16 MED ORDER — POTASSIUM CHLORIDE CRYS ER 20 MEQ PO TBCR: 20.0000 meq | EXTENDED_RELEASE_TABLET | Freq: Two times a day (BID) | ORAL | 1 refills | Status: DC

## 2019-02-16 MED ORDER — GABAPENTIN 300 MG PO CAPS
300.0000 mg | ORAL_CAPSULE | Freq: Three times a day (TID) | ORAL | Status: DC
  Administered 2019-02-16 – 2019-02-17 (×4): 300 mg via ORAL
  Filled 2019-02-16 (×4): qty 1

## 2019-02-16 MED ORDER — POTASSIUM CHLORIDE CRYS ER 20 MEQ PO TBCR
20.0000 meq | EXTENDED_RELEASE_TABLET | Freq: Two times a day (BID) | ORAL | Status: DC
  Administered 2019-02-16 – 2019-02-17 (×2): 20 meq via ORAL
  Filled 2019-02-16 (×2): qty 1

## 2019-02-16 MED ORDER — KETOROLAC TROMETHAMINE 15 MG/ML IJ SOLN
15.0000 mg | Freq: Four times a day (QID) | INTRAMUSCULAR | Status: DC
  Administered 2019-02-16 – 2019-02-17 (×4): 15 mg via INTRAVENOUS
  Filled 2019-02-16 (×4): qty 1

## 2019-02-16 NOTE — Discharge Summary (Signed)
Physician Discharge Summary   Patient ID: TALICIA SUI MRN: [id number] DOB/AGE: 06-03-1951 68 y.o.  Admit date: 02/14/2019 Discharge date: 02/17/2019  Primary Diagnosis: left knee primary osteoarthritis  Admission Diagnoses:  Past Medical History:  Diagnosis Date  . Arthritis    right knee arthritis uses celbrex to treat   . Asthma   . Family history of adverse reaction to anesthesia    sister- nausea and vomiting after surgery  . GERD (gastroesophageal reflux disease)    severe   . Hypertension   . Pre-diabetes    Discharge Diagnoses:   Principal Problem:   Osteoarthritis of left knee Active Problems:   Left knee DJD  Estimated body mass index is 33.94 kg/m as calculated from the following:   Height as of this encounter: 5\' 8"  (1.727 m).   Weight as of this encounter: 101.2 kg.  Procedure:  Procedure(s) (LRB): TOTAL KNEE ARTHROPLASTY (Left)   Consults: None  HPI: see H&P Laboratory Data: Admission on 02/14/2019  Component Date Value Ref Range Status  . Sodium 02/14/2019 138  135 - 145 mmol/L Final  . Potassium 02/14/2019 2.9* 3.5 - 5.1 mmol/L Final  . Chloride 02/14/2019 99  98 - 111 mmol/L Final  . CO2 02/14/2019 26  22 - 32 mmol/L Final  . Glucose, Bld 02/14/2019 145* 70 - 99 mg/dL Final  . BUN 02/14/2019 18  8 - 23 mg/dL Final  . Creatinine, Ser 02/14/2019 0.92  0.44 - 1.00 mg/dL Final  . Calcium 02/14/2019 9.4  8.9 - 10.3 mg/dL Final  . GFR calc non Af Amer 02/14/2019 >60  >60 mL/min Final  . GFR calc Af Amer 02/14/2019 >60  >60 mL/min Final  . Anion gap 02/14/2019 13  5 - 15 Final   Performed at [facility], [place] [address] [place], [place] [address]  . Sodium 02/14/2019 138  135 - 145 mmol/L Final  . Potassium 02/14/2019 3.1* 3.5 - 5.1 mmol/L Final  . Chloride 02/14/2019 96* 98 - 111 mmol/L Final  . CO2 02/14/2019 32  22 - 32 mmol/L Final  . Glucose, Bld 02/14/2019 105* 70 - 99 mg/dL Final  . BUN 02/14/2019 14  8 - 23 mg/dL Final   . Creatinine, Ser 02/14/2019 0.80  0.44 - 1.00 mg/dL Final  . Calcium 02/14/2019 8.8* 8.9 - 10.3 mg/dL Final  . GFR calc non Af Amer 02/14/2019 >60  >60 mL/min Final  . GFR calc Af Amer 02/14/2019 >60  >60 mL/min Final  . Anion gap 02/14/2019 10  5 - 15 Final   Performed at [facility], [place] [address], [place], [place] [address]  . WBC 02/15/2019 9.9  4.0 - 10.5 K/uL Final  . RBC 02/15/2019 4.76  3.87 - 5.11 MIL/uL Final  . Hemoglobin 02/15/2019 12.6  12.0 - 15.0 g/dL Final  . HCT 02/15/2019 40.5  36.0 - 46.0 % Final  . MCV 02/15/2019 85.1  80.0 - 100.0 fL Final  . MCH 02/15/2019 26.5  26.0 - 34.0 pg Final  . MCHC 02/15/2019 31.1  30.0 - 36.0 g/dL Final  . RDW 02/15/2019 15.9* 11.5 - 15.5 % Final  . Platelets 02/15/2019 210  150 - 400 K/uL Final  . nRBC 02/15/2019 0.0  0.0 - 0.2 % Final   Performed at [facility], [place] [address] [place], [place] [address]  . Sodium 02/15/2019 135  135 - 145 mmol/L Final  . Potassium 02/15/2019 3.1* 3.5 - 5.1 mmol/L Final  .  Chloride 02/15/2019 97* 98 - 111 mmol/L Final  . CO2 02/15/2019 30  22 - 32 mmol/L Final  . Glucose, Bld 02/15/2019 144* 70 - 99 mg/dL Final  . BUN 02/15/2019 8  8 - 23 mg/dL Final  . Creatinine, Ser 02/15/2019 0.78  0.44 - 1.00 mg/dL Final  . Calcium 02/15/2019 8.5* 8.9 - 10.3 mg/dL Final  . GFR calc non Af Amer 02/15/2019 >60  >60 mL/min Final  . GFR calc Af Amer 02/15/2019 >60  >60 mL/min Final  . Anion gap 02/15/2019 8  5 - 15 Final   Performed at [facility], [place] [address] [place], [place] [address]  . WBC 02/16/2019 13.2* 4.0 - 10.5 K/uL Final  . RBC 02/16/2019 4.94  3.87 - 5.11 MIL/uL Final  . Hemoglobin 02/16/2019 12.8  12.0 - 15.0 g/dL Final  . HCT 02/16/2019 41.5  36.0 - 46.0 % Final  . MCV 02/16/2019 84.0  80.0 - 100.0 fL Final  . MCH 02/16/2019 25.9* 26.0 - 34.0 pg Final  . MCHC 02/16/2019 30.8  30.0 - 36.0 g/dL Final  . RDW 02/16/2019 15.4  11.5 - 15.5  % Final  . Platelets 02/16/2019 234  150 - 400 K/uL Final  . nRBC 02/16/2019 0.0  0.0 - 0.2 % Final   Performed at [facility], [place] [address], [place], [place] [address]  . Sodium 02/16/2019 133* 135 - 145 mmol/L Final  . Potassium 02/16/2019 3.0* 3.5 - 5.1 mmol/L Final  . Chloride 02/16/2019 92* 98 - 111 mmol/L Final  . CO2 02/16/2019 30  22 - 32 mmol/L Final  . Glucose, Bld 02/16/2019 136* 70 - 99 mg/dL Final  . BUN 02/16/2019 9  8 - 23 mg/dL Final  . Creatinine, Ser 02/16/2019 0.80  0.44 - 1.00 mg/dL Final  . Calcium 02/16/2019 8.9  8.9 - 10.3 mg/dL Final  . GFR calc non Af Amer 02/16/2019 >60  >60 mL/min Final  . GFR calc Af Amer 02/16/2019 >60  >60 mL/min Final  . Anion gap 02/16/2019 11  5 - 15 Final   Performed at [facility], [place] [address], [place], [place] [address]  Hospital Outpatient Visit on 02/09/2019  Component Date Value Ref Range Status  . MRSA, PCR 02/09/2019 NEGATIVE  NEGATIVE Final  . Staphylococcus aureus 02/09/2019 NEGATIVE  NEGATIVE Final   Comment: (NOTE) The Xpert SA Assay (FDA approved for NASAL specimens in patients 51 years of age and older), is one component of a comprehensive surveillance program. It is not intended to diagnose infection nor to guide or monitor treatment. Performed at [facility], [place] [address] [place], [place] [address]   . aPTT 02/09/2019 28  24 - 36 seconds Final   Performed at [facility], [place] [address], [place], [place] [address]  . Sodium 02/09/2019 139  135 - 145 mmol/L Final  . Potassium 02/09/2019 2.9* 3.5 - 5.1 mmol/L Final  . Chloride 02/09/2019 99  98 - 111 mmol/L Final  . CO2 02/09/2019 29  22 - 32 mmol/L Final  . Glucose, Bld 02/09/2019 136* 70 - 99 mg/dL Final  . BUN 02/09/2019 15  8 - 23 mg/dL Final  . Creatinine, Ser 02/09/2019 0.78  0.44 - 1.00 mg/dL Final  . Calcium 02/09/2019 9.1  8.9 - 10.3 mg/dL Final  . GFR calc non Af Amer  02/09/2019 >60  >60 mL/min Final  . GFR calc Af Amer 02/09/2019 >60  >60  mL/min Final  . Anion gap 02/09/2019 11  5 - 15 Final   Performed at [facility], [place] [address], [place], [place] [address]  . WBC 02/09/2019 8.5  4.0 - 10.5 K/uL Final  . RBC 02/09/2019 5.33* 3.87 - 5.11 MIL/uL Final  . Hemoglobin 02/09/2019 13.8  12.0 - 15.0 g/dL Final  . HCT 02/09/2019 45.5  36.0 - 46.0 % Final  . MCV 02/09/2019 85.4  80.0 - 100.0 fL Final  . MCH 02/09/2019 25.9* 26.0 - 34.0 pg Final  . MCHC 02/09/2019 30.3  30.0 - 36.0 g/dL Final  . RDW 02/09/2019 15.9* 11.5 - 15.5 % Final  . Platelets 02/09/2019 244  150 - 400 K/uL Final  . nRBC 02/09/2019 0.0  0.0 - 0.2 % Final   Performed at [facility], [place] [address], [place], [place] [address]  . Prothrombin Time 02/09/2019 13.4  11.4 - 15.2 seconds Final  . INR 02/09/2019 1.0  0.8 - 1.2 Final   Comment: (NOTE) INR goal varies based on device and disease states. Performed at [facility], [place] [address], [place], [place] [address]   . Color, Urine 02/09/2019 YELLOW  YELLOW Final  . APPearance 02/09/2019 CLEAR  CLEAR Final  . Specific Gravity, Urine 02/09/2019 1.018  1.005 - 1.030 Final  . pH 02/09/2019 5.0  5.0 - 8.0 Final  . Glucose, UA 02/09/2019 NEGATIVE  NEGATIVE mg/dL Final  . Hgb urine dipstick 02/09/2019 LARGE* NEGATIVE Final  . Bilirubin Urine 02/09/2019 NEGATIVE  NEGATIVE Final  . Ketones, ur 02/09/2019 NEGATIVE  NEGATIVE mg/dL Final  . Protein, ur 02/09/2019 NEGATIVE  NEGATIVE mg/dL Final  . Nitrite 02/09/2019 NEGATIVE  NEGATIVE Final  . Leukocytes,Ua 02/09/2019 NEGATIVE  NEGATIVE Final  . RBC / HPF 02/09/2019 11-20  0 - 5 RBC/hpf Final  . WBC, UA 02/09/2019 0-5  0 - 5 WBC/hpf Final  . Bacteria, UA 02/09/2019 RARE* NONE SEEN Final  . Squamous Epithelial / LPF 02/09/2019 0-5  0 - 5 Final  . Mucus 02/09/2019 PRESENT   Final   Performed at [facility], [place]  [address] [place], [place] [address]     X-Rays:Dg Knee Left Port  Result Date: 02/14/2019 CLINICAL DATA:  Status post left knee replacement. EXAM: PORTABLE LEFT KNEE - 1-2 VIEW COMPARISON:  None. FINDINGS: The femoral and tibial components appear to be well situated. Expected postoperative changes are noted in the soft tissues anteriorly. IMPRESSION: Status post left total knee arthroplasty. Electronically Signed   By: Marijo [place], M.D.   On: 02/14/2019 11:56    EKG: Orders placed or performed during the hospital encounter of 04/06/18  . EKG 12 lead  . EKG 12 lead     Hospital Course: Margaret Cisneros is a 67 y.o. who was admitted to [facility]. They were brought to the operating room on 02/14/2019 and underwent Procedure(s): TOTAL KNEE ARTHROPLASTY.  Patient tolerated the procedure well and was later transferred to the recovery room and then to the orthopaedic floor for postoperative care.  They were given PO and IV analgesics for pain control following their surgery.  They were given 24 hours of postoperative antibiotics of  Anti-infectives (From admission, onward)   Start     Dose/Rate Route Frequency Ordered Stop   02/14/19 1400  ceFAZolin (ANCEF) IVPB 2g/100 mL premix     2 g 200 mL/hr over 30 Minutes Intravenous Every 6 hours 02/14/19 1337 02/14/19 2022  02/14/19 1002  polymyxin B 500,000 Units, bacitracin 50,000 Units in sodium chloride 0.9 % 500 mL irrigation  Status:  Discontinued       As needed 02/14/19 1002 02/14/19 1116   02/14/19 0615  ceFAZolin (ANCEF) IVPB 2g/100 mL premix     2 g 200 mL/hr over 30 Minutes Intravenous On call to O.R. 02/14/19 6734 02/14/19 0856     and started on DVT prophylaxis in the form of Aspirin, TED hose and SCDs.   PT and OT were ordered for total joint protocol.  Discharge planning consulted to help with postop disposition and equipment needs.  Patient had a fair night on the evening of surgery.  They started to get up OOB with therapy  on day one. Potassium noted to be low pre-op so IV fluids did contain potassium and supplementation was started when IV fluids discontinued.  Continued to work with therapy into day two.  By day three, the patient had progressed with therapy and meeting their goals.  Incision was healing well.  Patient was seen in rounds and was ready to go home.   Diet: Regular diet Activity:WBAT Follow-up:in 10-14 days Disposition - Home. Outpatient PT scheduled at [facility] for 02/19/19. Discharged Condition: good    Allergies as of 02/16/2019   No Known Allergies     Medication List    STOP taking these medications   aspirin EC 81 MG tablet Replaced by:  aspirin 81 MG chewable tablet   naproxen sodium 220 MG tablet Commonly known as:  ALEVE   omeprazole 40 MG capsule Commonly known as:  PRILOSEC     TAKE these medications   albuterol 108 (90 Base) MCG/ACT inhaler Commonly known as:  PROVENTIL HFA;VENTOLIN HFA Inhale 2 puffs into the lungs every 6 (six) hours as needed for wheezing or shortness of breath.   aspirin 81 MG chewable tablet Chew 1 tablet (81 mg total) by mouth 2 (two) times daily. Replaces:  aspirin EC 81 MG tablet   celecoxib 200 MG capsule Commonly known as:  CELEBREX Take 200 mg by mouth daily.   docusate sodium 100 MG capsule Commonly known as:  COLACE Take 1 capsule (100 mg total) by mouth 2 (two) times daily.   gabapentin 300 MG capsule Commonly known as:  NEURONTIN Take 1 capsule (300 mg total) by mouth 3 (three) times daily as needed (pain, tingling).   hydrochlorothiazide 25 MG tablet Commonly known as:  HYDRODIURIL Take 25 mg by mouth daily.   methocarbamol 500 MG tablet Commonly known as:  ROBAXIN Take 1 tablet (500 mg total) by mouth every 6 (six) hours as needed for muscle spasms.   ondansetron 4 MG tablet Commonly known as:  ZOFRAN Take 1 tablet (4 mg total) by mouth every 6 (six) hours as needed for nausea.   oxyCODONE 5 MG immediate release  tablet Commonly known as:  Oxy IR/ROXICODONE Take 1-2 tablets (5-10 mg total) by mouth every 4 (four) hours as needed for moderate pain (pain score 4-6).   polyethylene glycol packet Commonly known as:  MIRALAX / GLYCOLAX Take 17 g by mouth daily as needed for mild constipation.   potassium chloride SA 20 MEQ tablet Commonly known as:  K-DUR,KLOR-CON Take 1 tablet (20 mEq total) by mouth 2 (two) times daily.   Vitamin D (Ergocalciferol) 1.25 MG (50000 UT) Caps capsule Commonly known as:  DRISDOL Take 50,000 Units by mouth every Monday.      Follow-up Information    Susa Day,  MD Follow up in 2 week(s).   Specialty:  Orthopedic Surgery Contact information: [address] [place] [address] [phone]        [facility] Go on 02/19/2019.   Specialty:  Specialist Why:  @ 3p. Outpatient PT Contact information: [place] [address] [place] [address] [phone]        [facility] Follow up in 1 week(s).   Why:  call ASAP to be seen approx. one week post-op for recheck of potassium level and to discuss if continued supplementation is needed. Contact information: [address] [place] [address] [phone]           Signed: Lacie Draft, PA-C Orthopaedic Surgery 02/16/2019, 10:01 AM

## 2019-02-16 NOTE — Progress Notes (Signed)
Physical Therapy Treatment Patient Details Name: Margaret Cisneros MRN: [id number] DOB: January 23, 1951 Today's Date: 02/16/2019    History of Present Illness Pt s/p L TKR and with hx of L THR (5/19)    PT Comments    POD # 2 pm session as pt slept all morning. Assisted with amb in hallway.  General Gait Details: <25% cues for sequence, posture and position from RW.  Decreased gait seped for age.  Then returned to room to perform some TE's following HEP handout.  Instructed on proper tech, freq as well as use of ICE.   Pt plans to D/C to [facility] tomorrow  Follow Up Recommendations  Follow surgeon's recommendation for DC plan and follow-up therapies;Home health PT     Equipment Recommendations  None recommended by PT    Recommendations for Other Services       Precautions / Restrictions Precautions Precautions: Fall;Knee Precaution Comments: did not use KI today Restrictions Weight Bearing Restrictions: No Other Position/Activity Restrictions: WBAT    Mobility  Bed Mobility               General bed mobility comments: OOB in recliner   Transfers Overall transfer level: Needs assistance Equipment used: Rolling walker (2 wheeled) Transfers: Sit to/from [employer] Transfers Sit to Stand: Supervision;Min guard Stand pivot transfers: Supervision;Min guard;From elevated surface       General transfer comment: increased, increased time and 50% VC's to "power up" B UE's.  Pt present with limited R knee flex (good leg) and diffuculty due to BMI  Ambulation/Gait Ambulation/Gait assistance: Supervision;Min guard Gait Distance (Feet): 65 Feet Assistive device: Rolling walker (2 wheeled) Gait Pattern/deviations: Step-to pattern;Decreased step length - right;Decreased step length - left;Shuffle;Trunk flexed Gait velocity: decr   General Gait Details: <25% cues for sequence, posture and position from RW.  Decreased gait seped for age   Stairs              Wheelchair Mobility    Modified Rankin (Stroke Patients Only)       Balance                                            Cognition Arousal/Alertness: Awake/alert Behavior During Therapy: WFL for tasks assessed/performed Overall Cognitive Status: Within Functional Limits for tasks assessed                                        Exercises   Total Knee Replacement TE's 10 reps B LE ankle pumps 10 reps towel squeezes 10 reps knee presses 10 reps heel slides  10 reps SAQ's 10 reps SLR's 10 reps ABD Followed by ICE    General Comments        Pertinent Vitals/Pain Pain Assessment: 0-10 Pain Score: 5  Pain Location: L knee Pain Descriptors / Indicators: Aching;Sore Pain Intervention(s): Monitored during session;Repositioned;Premedicated before session;Ice applied    Home Living                      Prior Function            PT Goals (current goals can now be found in the care plan section) Progress towards PT goals: Progressing toward goals    Frequency    7X/week  PT Plan Current plan remains appropriate    Co-evaluation              AM-PAC PT "6 Clicks" Mobility   Outcome Measure  Help needed turning from your back to your side while in a flat bed without using bedrails?: A Little Help needed moving from lying on your back to sitting on the side of a flat bed without using bedrails?: A Little Help needed moving to and from a bed to a chair (including a wheelchair)?: A Little Help needed standing up from a chair using your arms (e.g., wheelchair or bedside chair)?: A Little Help needed to walk in hospital room?: A Little Help needed climbing 3-5 steps with a railing? : A Little 6 Click Score: 18    End of Session Equipment Utilized During Treatment: Gait belt Activity Tolerance: Patient tolerated treatment well Patient left: in chair;with call bell/phone within reach Nurse Communication:  Mobility status PT Visit Diagnosis: Difficulty in walking, not elsewhere classified (R26.2)     Time: 3403-7096 PT Time Calculation (min) (ACUTE ONLY): 30 min  Charges:  $Gait Training: 8-22 mins $Therapeutic Exercise: 8-22 mins                     Rica Koyanagi  PTA Acute  Rehabilitation Services Pager      [phone] Office      [phone]

## 2019-02-16 NOTE — Plan of Care (Signed)
Pt alert and oriented, pain better controlled today. Progressing well with therapy. RN will monitor.

## 2019-02-16 NOTE — Care Management CC44 (Signed)
Condition Code 44 Documentation Completed  Patient Details  Name: Margaret Cisneros MRN: [id number] Date of Birth: 1951/03/25   Condition Code 44 given:  Yes Patient signature on Condition Code 44 notice:  Yes Documentation of 2 MD's agreement:  Yes Code 44 added to claim:  Yes    Leeroy Cha, RN 02/16/2019, 12:34 PM

## 2019-02-16 NOTE — Care Management Important Message (Signed)
Important Message  Patient Details  Name: TENNYSON KALLEN MRN: [id number] Date of Birth: [birth year]   Medicare Important Message Given:  Yes    Kerin Salen 02/16/2019, 12:59 [place] Message  Patient Details  Name: COOKIE PORE MRN: [id number] Date of Birth: September 15, 1951   Medicare Important Message Given:  Yes    Kerin Salen 02/16/2019, 12:59 PM

## 2019-02-16 NOTE — Progress Notes (Signed)
   Vital Signs MEWS/VS Documentation       02/16/2019 0542 02/16/2019 0655 02/16/2019 0834 02/16/2019 1235   MEWS Score:  2  1  1  2    MEWS Score Color:  Yellow  Green  Green  Yellow   Resp:  --  --  --  16   Pulse:  (!) 117  (!) 101  --  (!) 120   BP:  (!) 137/96  --  --  124/76   Temp:  99.3 F (37.4 C)  --  --  98.4 F (36.9 C)   O2 Device:  Room Air  --  --  Room Air   Level of Consciousness:  --  --  Alert  --      Dr. Tonita Cong had just rounded on pt, pt was very sleepy when he rounded, but was arousable when RN/CNA went into room. Vitals taken and HR was found to be 117-120 after he left unit. Dierdre Highman, PA made aware and will let Dr. Tonita Cong know.  RN will monitor.    Buena Irish 02/16/2019,12:48 PM

## 2019-02-16 NOTE — Progress Notes (Addendum)
Subjective: 2 Days Post-Op Procedure(s) (LRB): TOTAL KNEE ARTHROPLASTY (Left) Patient reports pain as moderate.   Reports tingling at top of incision. Nauseous (likely due to amt of oxy for pain control).  Doesn't feel she could go home today and not ready to do PT today due to pain.  Objective: Vital signs in last 24 hours: Temp:  [98 F (36.7 C)-99.3 F (37.4 C)] 99.3 F (37.4 C) (03/13 0542) Pulse Rate:  [85-117] 101 (03/13 0655) BP: (126-153)/(85-96) 137/96 (03/13 0542) SpO2:  [91 %-100 %] 91 % (03/13 0542)  Intake/Output from previous day: 03/12 0701 - 03/13 0700 In: 1577 [P.O.:1200; I.V.:377] Out: 900 [Urine:900] Intake/Output this shift: No intake/output data recorded.  Recent Labs    02/15/19 0523 02/16/19 0519  HGB 12.6 12.8   Recent Labs    02/15/19 0523 02/16/19 0519  WBC 9.9 13.2*  RBC 4.76 4.94  HCT 40.5 41.5  PLT 210 234   Recent Labs    02/14/19 1346 02/15/19 0523  NA 138 135  K 3.1* 3.1*  CL 96* 97*  CO2 32 30  BUN 14 8  CREATININE 0.80 0.78  GLUCOSE 105* 144*  CALCIUM 8.8* 8.5*   No results for input(s): LABPT, INR in the last 72 hours.  Neurologically intact ABD soft Neurovascular intact Sensation intact distally Intact pulses distally Dorsiflexion/Plantar flexion intact Incision: dressing C/D/I and no drainage No cellulitis present Compartment soft no calf pain or sign of DVT   Assessment/Plan: 2 Days Post-Op Procedure(s) (LRB): TOTAL KNEE ARTHROPLASTY (Left) Advance diet Up with therapy D/C IV fluids   Anticipated LOS equal to or greater than 2 midnights due to - Age 66 and older with one or more of the following:  - Obesity  - Expected need for hospital services (PT, OT, Nursing) required for safe  discharge  - Anticipated need for postoperative skilled nursing care or inpatient rehab  - Active co-morbidities: None OR   - Unanticipated findings during/Post Surgery: Slow post-op progression: GI, pain control,  mobility  - Patient is a high risk of re-admission due to: None  Add gabapentin 300mg  TID for neuropathic pain Recheck bmet today - may need to increase amt of potassium and plan to send home with potassium supplement with follow up with PCP regarding that Will also add toradol  Discussed with Dr. Tonita Cong Plan D/C tomorrow, outpt PT set up for Monday at our office  [place] 02/16/2019, 8:26 AM   Bmet done, K down from 3.1 to 3.0 this AM Will increase to K-dur 20 meQ BID to start today and send home with same dose Needs to follow up with PCP next week to recheck K and discuss continued supplementation Discussed with Dr. Tonita Cong

## 2019-02-16 NOTE — Care Management Obs Status (Signed)
[place] NOTIFICATION   Patient Details  Name: Margaret Cisneros MRN: [id number] Date of Birth: 07-21-1951   Medicare Observation Status Notification Given:  Yes    Leeroy Cha, RN 02/16/2019, 12:34 PM

## 2019-02-17 DIAGNOSIS — M1712 Unilateral primary osteoarthritis, left knee: Secondary | ICD-10-CM | POA: Diagnosis not present

## 2019-02-17 LAB — BASIC METABOLIC PANEL
ANION GAP: 11 (ref 5–15)
BUN: 21 mg/dL (ref 8–23)
CO2: 31 mmol/L (ref 22–32)
Calcium: 8.5 mg/dL — ABNORMAL LOW (ref 8.9–10.3)
Chloride: 91 mmol/L — ABNORMAL LOW (ref 98–111)
Creatinine, Ser: 1.16 mg/dL — ABNORMAL HIGH (ref 0.44–1.00)
GFR calc Af Amer: 57 mL/min — ABNORMAL LOW (ref >60)
GFR calc non Af Amer: 49 mL/min — ABNORMAL LOW (ref >60)
Glucose, Bld: 148 mg/dL — ABNORMAL HIGH (ref 70–99)
Potassium: 3.2 mmol/L — ABNORMAL LOW (ref 3.5–5.1)
Sodium: 133 mmol/L — ABNORMAL LOW (ref 135–145)

## 2019-02-17 LAB — CBC
HCT: 39 % (ref 36.0–46.0)
Hemoglobin: 11.9 g/dL — ABNORMAL LOW (ref 12.0–15.0)
MCH: 25.9 pg — ABNORMAL LOW (ref 26.0–34.0)
MCHC: 30.5 g/dL (ref 30.0–36.0)
MCV: 84.8 fL (ref 80.0–100.0)
Platelets: 227 10*3/uL (ref 150–400)
RBC: 4.6 MIL/uL (ref 3.87–5.11)
RDW: 15.8 % — ABNORMAL HIGH (ref 11.5–15.5)
WBC: 12.6 10*3/uL — ABNORMAL HIGH (ref 4.0–10.5)
nRBC: 0 % (ref 0.0–0.2)

## 2019-02-17 NOTE — Progress Notes (Signed)
Margaret Cisneros  MRN: [id number] DOB/Age: [birth year] 68 y.o. [facility] Procedure: Procedure(s) (LRB): TOTAL KNEE ARTHROPLASTY (Left)     Subjective: Feeling much better today. Still to work with PT before DC home today  Vital Signs Temp:  [98.4 F (36.9 C)-99.3 F (37.4 C)] 98.8 F (37.1 C) (03/14 0521) Pulse Rate:  [88-120] 108 (03/14 0521) Resp:  [14-16] 16 (03/14 0521) BP: (104-126)/(66-80) 104/80 (03/14 0521) SpO2:  [95 %-99 %] 95 % (03/14 0521)  Lab Results Recent Labs    02/16/19 0519 02/17/19 0517  WBC 13.2* 12.6*  HGB 12.8 11.9*  HCT 41.5 39.0  PLT 234 227   BMET Recent Labs    02/16/19 0852 02/17/19 0517  NA 133* 133*  K 3.0* 3.2*  CL 92* 91*  CO2 30 31  GLUCOSE 136* 148*  BUN 9 21  CREATININE 0.80 1.16*  CALCIUM 8.9 8.5*   INR  Date Value Ref Range Status  02/09/2019 1.0 0.8 - 1.2 Final    Comment:    (NOTE) INR goal varies based on device and disease states. Performed at [facility], [place] [address], [place], [place] [address]      Exam Left knee dressing dry. Minimal swelling NVI        Plan DC home after PT Outpatient PT to start Monday  [facility] PA-C  02/17/2019, 11:20 AM Contact # [phone]

## 2019-02-17 NOTE — Progress Notes (Signed)
Physical Therapy Treatment Patient Details Name: Margaret Cisneros MRN: [id number] DOB: Apr 06, 1951 Today's Date: 02/17/2019    History of Present Illness Pt s/p L TKR and with hx of L THR (5/19)    PT Comments    Pt with improved ambulation distance this session, and performed both stair navigation and LE exercises well. PT reviewed entering and exiting bed with pt, instructing pt to use RLE underneath LLE to assist in bringing her LLE into bed. Pt with no further questions, pt to start PT on Monday. PT to continue to follow acutely.    Follow Up Recommendations  Follow surgeon's recommendation for DC plan and follow-up therapies;Home health PT     Equipment Recommendations  None recommended by PT    Recommendations for Other Services       Precautions / Restrictions Precautions Precautions: Fall Restrictions Weight Bearing Restrictions: No Other Position/Activity Restrictions: WBAT    Mobility  Bed Mobility               General bed mobility comments: OOB in recliner, left in recliner after session   Transfers Overall transfer level: Needs assistance Equipment used: Rolling walker (2 wheeled) Transfers: Sit to/from Stand Sit to Stand: Supervision         General transfer comment: Supervision for safety. Pt using RW to push to full standing.   Ambulation/Gait Ambulation/Gait assistance: Supervision Gait Distance (Feet): 125 Feet Assistive device: Rolling walker (2 wheeled) Gait Pattern/deviations: Shuffle;Trunk flexed;Step-through pattern;Decreased step length - left;Decreased stance time - left;Antalgic Gait velocity: decr   General Gait Details: No verbal cuing for form required for ambulation this session. supervision for safety.    Stairs Stairs: Yes Stairs assistance: Min guard;Supervision Stair Management: Two rails;Step to pattern;Forwards Number of Stairs: 2(2x2 steps ) General stair comments: min guard to supervision for safety. Verbal cuing  for sequencing (up with the good, down with the bad), placement of foot on stairs. No verbal cuing required for 2nd attempt.    Wheelchair Mobility    Modified Rankin (Stroke Patients Only)       Balance Overall balance assessment: Mild deficits observed, not formally tested                                          Cognition Arousal/Alertness: Awake/alert Behavior During Therapy: WFL for tasks assessed/performed Overall Cognitive Status: Within Functional Limits for tasks assessed                                        Exercises Total Joint Exercises Ankle Circles/Pumps: AROM;Both;10 reps;Seated Quad Sets: AROM;10 reps;Left;Seated Towel Squeeze: AROM;Both;10 reps;Seated Short Arc Quad: AAROM;Left;10 reps;Seated Heel Slides: AAROM;Left;10 reps;Seated(with gait belt on foot) Hip ABduction/ADduction: AAROM;Left;10 reps;Seated Straight Leg Raises: AAROM;Left;5 reps;Seated Knee Flexion: AAROM;Left;10 reps;Seated Goniometric ROM: knee ROM during session ~5-80*, limited by pain     General Comments        Pertinent Vitals/Pain Pain Assessment: 0-10 Pain Score: 3  Pain Location: L knee Pain Descriptors / Indicators: Sore Pain Intervention(s): Limited activity within patient's tolerance;Premedicated before session;Monitored during session    Home Living                      Prior Function  PT Goals (current goals can now be found in the care plan section) Acute Rehab PT Goals Patient Stated Goal: Regain IND PT Goal Formulation: All assessment and education complete, DC therapy Time For Goal Achievement: 02/21/19 Potential to Achieve Goals: Good Progress towards PT goals: Progressing toward goals    Frequency    7X/week      PT Plan Current plan remains appropriate    Co-evaluation              AM-PAC PT "6 Clicks" Mobility   Outcome Measure  Help needed turning from your back to your side while  in a flat bed without using bedrails?: A Little Help needed moving from lying on your back to sitting on the side of a flat bed without using bedrails?: A Little Help needed moving to and from a bed to a chair (including a wheelchair)?: A Little Help needed standing up from a chair using your arms (e.g., wheelchair or bedside chair)?: A Little Help needed to walk in hospital room?: A Little Help needed climbing 3-5 steps with a railing? : A Little 6 Click Score: 18    End of Session Equipment Utilized During Treatment: Gait belt Activity Tolerance: Patient tolerated treatment well Patient left: in chair;with call bell/phone within reach Nurse Communication: Mobility status;Other (comment)(Pt ready to d/c ) PT Visit Diagnosis: Difficulty in walking, not elsewhere classified (R26.2)     Time: 1135-1200 PT Time Calculation (min) (ACUTE ONLY): 25 min  Charges:  $Gait Training: 8-22 mins $Therapeutic Exercise: 8-22 mins                     Julien Girt, PT Acute Rehabilitation Services Pager [phone]  Office [phone]   Bless Belshe D Elonda Husky 02/17/2019, 1:35 PM

## 2019-02-17 NOTE — Progress Notes (Signed)
DC instructions provided and explained in detail. Pt verbalizes understanding. Escorted to lobby in [place] via nursing tech to be discharged home with family member

## 2019-02-17 NOTE — Plan of Care (Signed)
  Problem: Education: Goal: Knowledge of General Education information will improve Description Including pain rating scale, medication(s)/side effects and non-pharmacologic comfort measures  Outcome: Progressing   Problem: Clinical Measurements: Goal: Ability to maintain clinical measurements within normal limits will improve Outcome: Progressing   Problem: Nutrition: Goal: Adequate nutrition will be maintained Outcome: Progressing   Problem: Activity: Goal: Risk for activity intolerance will decrease Outcome: Progressing   Problem: Pain Managment: Goal: General experience of comfort will improve Outcome: Progressing

## 2020-02-04 ENCOUNTER — Ambulatory Visit: Payer: Medicare HMO | Attending: Internal Medicine

## 2020-02-04 DIAGNOSIS — Z23 Encounter for immunization: Secondary | ICD-10-CM | POA: Insufficient documentation

## 2020-02-04 NOTE — Progress Notes (Signed)
   Covid-19 Vaccination Clinic  Name:  Margaret Cisneros    MRN: [id number] DOB: 02-Apr-1951  02/04/2020  Ms. Rinier was observed post Covid-19 immunization for 15 minutes without incidence. She was provided with Vaccine Information Sheet and instruction to access the V-Safe system.   Ms. Peerenboom was instructed to call 911 with any severe reactions post vaccine: Marland Kitchen Difficulty breathing  . Swelling of your face and throat  . A fast heartbeat  . A bad rash all over your body  . Dizziness and weakness    Immunizations Administered    Name Date Dose VIS Date Route   Pfizer COVID-19 Vaccine 02/04/2020 12:57 PM 0.3 mL 11/16/2019 Intramuscular   Manufacturer: [place]   Lot: [id number]   [place]: [id number]

## 2020-03-04 ENCOUNTER — Ambulatory Visit: Payer: Medicare HMO | Attending: Internal Medicine

## 2020-03-04 DIAGNOSIS — Z23 Encounter for immunization: Secondary | ICD-10-CM

## 2020-03-04 NOTE — Progress Notes (Signed)
   Covid-19 Vaccination Clinic  Name:  Margaret Cisneros    MRN: [id number] DOB: 1951/08/17  03/04/2020  [name] was observed post Covid-19 immunization for 15 minutes without incident. She was provided with Vaccine Information Sheet and instruction to access the V-Safe system.   [name] was instructed to call 911 with any severe reactions post vaccine: Marland Kitchen Difficulty breathing  . Swelling of face and throat  . A fast heartbeat  . A bad rash all over body  . Dizziness and weakness   Immunizations Administered    Name Date Dose VIS Date Route   Pfizer COVID-19 Vaccine 03/04/2020 12:35 PM 0.3 mL 11/16/2019 Intramuscular   Manufacturer: [place]   Lot: [id number]   [place]: [id number]

## 2020-06-03 ENCOUNTER — Other Ambulatory Visit: Payer: Self-pay

## 2020-06-03 ENCOUNTER — Encounter (HOSPITAL_COMMUNITY): Payer: Self-pay

## 2020-06-03 ENCOUNTER — Emergency Department (HOSPITAL_COMMUNITY)
Admission: EM | Admit: 2020-06-03 | Discharge: 2020-06-03 | Disposition: A | Discharge status: Home / Self Care | Payer: Medicare HMO | Attending: Emergency Medicine | Admitting: Emergency Medicine

## 2020-06-03 DIAGNOSIS — R319 Hematuria, unspecified: Secondary | ICD-10-CM | POA: Diagnosis not present

## 2020-06-03 DIAGNOSIS — Z7901 Long term (current) use of anticoagulants: Secondary | ICD-10-CM | POA: Diagnosis not present

## 2020-06-03 DIAGNOSIS — Z96642 Presence of left artificial hip joint: Secondary | ICD-10-CM | POA: Diagnosis not present

## 2020-06-03 DIAGNOSIS — Z87891 Personal history of nicotine dependence: Secondary | ICD-10-CM | POA: Diagnosis not present

## 2020-06-03 DIAGNOSIS — Z7982 Long term (current) use of aspirin: Secondary | ICD-10-CM | POA: Diagnosis not present

## 2020-06-03 DIAGNOSIS — Z79899 Other long term (current) drug therapy: Secondary | ICD-10-CM | POA: Diagnosis not present

## 2020-06-03 DIAGNOSIS — Z7984 Long term (current) use of oral hypoglycemic drugs: Secondary | ICD-10-CM | POA: Insufficient documentation

## 2020-06-03 DIAGNOSIS — I10 Essential (primary) hypertension: Secondary | ICD-10-CM | POA: Diagnosis not present

## 2020-06-03 DIAGNOSIS — R7303 Prediabetes: Secondary | ICD-10-CM | POA: Insufficient documentation

## 2020-06-03 DIAGNOSIS — J45909 Unspecified asthma, uncomplicated: Secondary | ICD-10-CM | POA: Insufficient documentation

## 2020-06-03 DIAGNOSIS — Z96652 Presence of left artificial knee joint: Secondary | ICD-10-CM | POA: Diagnosis not present

## 2020-06-03 LAB — CBC WITH DIFFERENTIAL/PLATELET
Abs Immature Granulocytes: 0.11 10*3/uL — ABNORMAL HIGH (ref 0.00–0.07)
Basophils Absolute: 0 10*3/uL (ref 0.0–0.1)
Basophils Relative: 0 %
Eosinophils Absolute: 0.1 10*3/uL (ref 0.0–0.5)
Eosinophils Relative: 1 %
HCT: 38.7 % (ref 36.0–46.0)
Hemoglobin: 12.3 g/dL (ref 12.0–15.0)
Immature Granulocytes: 1 %
Lymphocytes Relative: 31 %
Lymphs Abs: 3.3 10*3/uL (ref 0.7–4.0)
MCH: 25.9 pg — ABNORMAL LOW (ref 26.0–34.0)
MCHC: 31.8 g/dL (ref 30.0–36.0)
MCV: 81.5 fL (ref 80.0–100.0)
Monocytes Absolute: 0.8 10*3/uL (ref 0.1–1.0)
Monocytes Relative: 7 %
Neutro Abs: 6.3 10*3/uL (ref 1.7–7.7)
Neutrophils Relative %: 60 %
Platelets: 258 10*3/uL (ref 150–400)
RBC: 4.75 MIL/uL (ref 3.87–5.11)
RDW: 17.2 % — ABNORMAL HIGH (ref 11.5–15.5)
WBC: 10.5 10*3/uL (ref 4.0–10.5)
nRBC: 0 % (ref 0.0–0.2)

## 2020-06-03 LAB — BASIC METABOLIC PANEL
Anion gap: 11 (ref 5–15)
BUN: 19 mg/dL (ref 8–23)
CO2: 28 mmol/L (ref 22–32)
Calcium: 9.7 mg/dL (ref 8.9–10.3)
Chloride: 101 mmol/L (ref 98–111)
Creatinine, Ser: 0.89 mg/dL (ref 0.44–1.00)
GFR calc Af Amer: >60 mL/min (ref >60)
GFR calc non Af Amer: >60 mL/min (ref >60)
Glucose, Bld: 148 mg/dL — ABNORMAL HIGH (ref 70–99)
Potassium: 3.7 mmol/L (ref 3.5–5.1)
Sodium: 140 mmol/L (ref 135–145)

## 2020-06-03 LAB — URINALYSIS, ROUTINE W REFLEX MICROSCOPIC
Bilirubin Urine: NEGATIVE
Glucose, UA: NEGATIVE mg/dL
Ketones, ur: NEGATIVE mg/dL
Nitrite: NEGATIVE
Protein, ur: 30 mg/dL — AB
RBC / HPF: >50 RBC/hpf — ABNORMAL HIGH (ref 0–5)
Specific Gravity, Urine: 1.02 (ref 1.005–1.030)
WBC, UA: >50 WBC/hpf — ABNORMAL HIGH (ref 0–5)
pH: 5 (ref 5.0–8.0)

## 2020-06-03 MED ORDER — OXYCODONE-ACETAMINOPHEN 5-325 MG PO TABS
1.0000 | ORAL_TABLET | Freq: Once | ORAL | Status: AC
  Administered 2020-06-03: 1 via ORAL
  Filled 2020-06-03: qty 1

## 2020-06-03 MED ORDER — IBUPROFEN 200 MG PO TABS
600.0000 mg | ORAL_TABLET | Freq: Once | ORAL | Status: AC
  Administered 2020-06-03: 600 mg via ORAL
  Filled 2020-06-03: qty 3

## 2020-06-03 NOTE — ED Notes (Signed)
Pen pad not functioning for dc signature.

## 2020-06-03 NOTE — ED Triage Notes (Signed)
Patient reports vaginal bleeding intermittently since March. This has also happened in 2012 per patient.  Patient states she has a hormonal imbalance.   Patient had CT scan to rule out kidney issues per patient (6-14- 2021)  C/O lower back pain (left to right) 10/10 pain   Denies N/V or shob.   A/Ox4 Ambulatory in triage.

## 2020-06-05 LAB — URINE CULTURE: Culture: 60000 — AB

## 2020-06-10 NOTE — ED Provider Notes (Signed)
[facility] DEPT Provider Note   CSN: [id number] Arrival date & time: 06/03/20  1519     History Chief Complaint  Patient presents with  . Vaginal Bleeding    Margaret Cisneros is a 69 y.o. female.  HPI   69 year old female with hematuria.  She has noticed blood in your urine with occasional clots.  She does not describe what sounds like vaginal bleeding to me.  She states that her PCP tried putting her on some hormones which did not improve her symptoms.  No dysuria.  She has had some lower back pain though.  Denies any acute trauma or strain.  Past Medical History:  Diagnosis Date  . Arthritis    right knee arthritis uses celbrex to treat   . Asthma   . Family history of adverse reaction to anesthesia    sister- nausea and vomiting after surgery  . GERD (gastroesophageal reflux disease)    severe   . Hypertension   . Pre-diabetes     Patient Active Problem List   Diagnosis Date Noted  . Primary osteoarthritis of left knee 02/16/2019  . Osteoarthritis of left knee 02/14/2019  . Left knee DJD 02/14/2019  . Osteoarthritis of left hip 04/06/2018    Past Surgical History:  Procedure Laterality Date  . JOINT REPLACEMENT    . TOTAL HIP ARTHROPLASTY Left 04/06/2018   Procedure: LEFT TOTAL HIP ARTHROPLASTY ANTERIOR APPROACH;  Surgeon: Rod Can, MD;  Location: [facility] ORS;  Service: Orthopedics;  Laterality: Left;  Needs RNFA  . TOTAL KNEE ARTHROPLASTY Left 02/14/2019   Procedure: TOTAL KNEE ARTHROPLASTY;  Surgeon: Susa Day, MD;  Location: [facility];  Service: Orthopedics;  Laterality: Left;  150 mins     OB History   No obstetric history on file.     History reviewed. No pertinent family history.  Social History   Tobacco Use  . Smoking status: Former Smoker    Years: 3.00    Types: Cigarettes    Quit date: 2000    Years since quitting: 21.5  . Smokeless tobacco: Never Used  Vaping Use  . Vaping Use: Never used  Substance Use  Topics  . Alcohol use: Not Currently  . Drug use: Never    Home Medications Prior to Admission medications   Medication Sig Start Date End Date Taking? Authorizing Provider  albuterol (PROVENTIL HFA;VENTOLIN HFA) 108 (90 Base) MCG/ACT inhaler Inhale 2 puffs into the lungs every 6 (six) hours as needed for wheezing or shortness of breath.    Yes [provider]  aspirin 81 MG chewable tablet Chew 1 tablet (81 mg total) by mouth 2 (two) times daily. 02/15/19  Yes Lacie Draft M, PA-C  celecoxib (CELEBREX) 200 MG capsule Take 200 mg by mouth daily.    Yes [provider]  Ergocalciferol (VITAMIN D2) 50 MCG (2000 UT) TABS Take 1 tablet by mouth daily. 04/22/20  Yes [provider]  fluticasone (FLONASE) 50 MCG/ACT nasal spray Place 2 sprays into both nostrils daily. 03/25/20  Yes [provider]  furosemide (LASIX) 20 MG tablet Take 20 mg by mouth daily.  03/24/20  Yes [provider]  gabapentin (NEURONTIN) 100 MG capsule Take 100 mg by mouth 3 (three) times daily. 03/18/20  Yes [provider]  hydrochlorothiazide (HYDRODIURIL) 25 MG tablet Take 25 mg by mouth daily.   Yes [provider]  losartan (COZAAR) 100 MG tablet Take 100 mg by mouth daily.   Yes [provider]  metFORMIN (GLUCOPHAGE) 500 MG tablet Take 500 mg by mouth 2 (two) times daily. 04/22/20  Yes [provider]  omeprazole (PRILOSEC) 40 MG capsule Take 40 mg by mouth daily. 04/08/20  Yes [provider]  potassium chloride (KLOR-CON) 10 MEQ tablet Take 10 mEq by mouth daily. 03/24/20  Yes [provider]  docusate sodium (COLACE) 100 MG capsule Take 1 capsule (100 mg total) by mouth 2 (two) times daily. Patient not taking: Reported on 06/03/2020 02/15/19   Cecilie Kicks, PA-C  gabapentin (NEURONTIN) 300 MG capsule Take 1 capsule (300 mg total) by mouth 3 (three) times daily as needed (pain, tingling). Patient not taking: Reported on  06/03/2020 02/16/19   Cecilie Kicks, PA-C  losartan (COZAAR) 50 MG tablet Take 50 mg by mouth daily. Patient not taking: Reported on 06/03/2020 03/22/20   [provider]  medroxyPROGESTERone (PROVERA) 10 MG tablet Take 10 mg by mouth daily. Patient not taking: Reported on 06/03/2020 05/22/20   [provider]  methocarbamol (ROBAXIN) 500 MG tablet Take 1 tablet (500 mg total) by mouth every 6 (six) hours as needed for muscle spasms. Patient not taking: Reported on 06/03/2020 02/15/19   Cecilie Kicks, PA-C  NOREL AD 4-10-325 MG TABS Take by mouth 2 (two) times daily as needed. Patient not taking: Reported on 06/03/2020 01/16/20   [provider]  ondansetron (ZOFRAN) 4 MG tablet Take 1 tablet (4 mg total) by mouth every 6 (six) hours as needed for nausea. Patient not taking: Reported on 06/03/2020 02/15/19   Cecilie Kicks, PA-C  oxyCODONE (OXY IR/ROXICODONE) 5 MG immediate release tablet Take 1-2 tablets (5-10 mg total) by mouth every 4 (four) hours as needed for moderate pain (pain score 4-6). Patient not taking: Reported on 06/03/2020 02/15/19   Cecilie Kicks, PA-C  polyethylene glycol [facility] / GLYCOLAX) packet Take 17 g by mouth daily as needed for mild constipation. Patient not taking: Reported on 06/03/2020 02/15/19   Cecilie Kicks, PA-C  potassium chloride SA (K-DUR,KLOR-CON) 20 MEQ tablet Take 1 tablet (20 mEq total) by mouth 2 (two) times daily. Patient not taking: Reported on 06/03/2020 02/16/19   Cecilie Kicks, PA-C  Vitamin D, Ergocalciferol, (DRISDOL) 1.25 MG (50000 UT) CAPS capsule Take 50,000 Units by mouth every Monday. Patient not taking: Reported on 06/03/2020    [provider]    Allergies    Patient has no known allergies.  Review of Systems   Review of Systems All systems reviewed and negative, other than as noted in HPI.  Physical Exam Updated Vital Signs BP (!) 145/89   Pulse 99   Temp 98.1 F (36.7 C) (Oral)   Resp  16   LMP  (LMP Unknown)   SpO2 96%   Physical Exam Vitals and nursing note reviewed.  Constitutional:      General: She is not in acute distress.    Appearance: She is well-developed.  HENT:     Head: Normocephalic and atraumatic.  Eyes:     General:        Right eye: No discharge.        Left eye: No discharge.     Conjunctiva/sclera: Conjunctivae normal.  Cardiovascular:     Rate and Rhythm: Normal rate and regular rhythm.     Heart sounds: Normal heart sounds. No murmur heard.  No friction rub. No gallop.   Pulmonary:     Effort: Pulmonary effort is normal. No respiratory distress.  Breath sounds: Normal breath sounds.  Abdominal:     General: There is no distension.     Palpations: Abdomen is soft.     Tenderness: There is no abdominal tenderness.  Musculoskeletal:     Cervical back: Neck supple.     Comments: Mild tenderness across the mid to lower lumbar back.  It is not any worse in the midline.  Neurovascularly intact in lower extremities.  Skin:    General: Skin is warm and dry.  Neurological:     Mental Status: She is alert.  Psychiatric:        Behavior: Behavior normal.        Thought Content: Thought content normal.     ED Results / Procedures / Treatments   Labs (all labs ordered are listed, but only abnormal results are displayed) Labs Reviewed  URINE CULTURE - Abnormal; Notable for the following components:      Result Value   Culture   (*)    Value: 60,000 COLONIES/mL MULTIPLE SPECIES PRESENT, SUGGEST RECOLLECTION   All other components within normal limits  CBC WITH DIFFERENTIAL/PLATELET - Abnormal; Notable for the following components:   MCH 25.9 (*)    RDW 17.2 (*)    Abs Immature Granulocytes 0.11 (*)    All other components within normal limits  URINALYSIS, ROUTINE W REFLEX MICROSCOPIC - Abnormal; Notable for the following components:   APPearance HAZY (*)    Hgb urine dipstick LARGE (*)    Protein, ur 30 (*)    Leukocytes,Ua SMALL  (*)    RBC / HPF >50 (*)    WBC, UA >50 (*)    Bacteria, UA RARE (*)    All other components within normal limits  BASIC METABOLIC PANEL - Abnormal; Notable for the following components:   Glucose, Bld 148 (*)    All other components within normal limits    EKG None  Radiology No results found.  Procedures Procedures (including critical care time)  Medications Ordered in ED Medications  ibuprofen (ADVIL) tablet 600 mg (600 mg Oral Given 06/03/20 2018)  oxyCODONE-acetaminophen (PERCOCET/ROXICET) 5-325 MG per tablet 1 tablet (1 tablet Oral Given 06/03/20 2018)    ED Course  I have reviewed the triage vital signs and the nursing notes.  Pertinent labs & imaging results that were available during my care of the patient were reviewed by me and considered in my medical decision making (see chart for details).    MDM Rules/Calculators/A&P                          69 year old female with hematuria.  At this point, I think the next most appropriate step would be urology follow.  Final Clinical Impression(s) / ED Diagnoses Final diagnoses:  Hematuria, unspecified type    Rx / DC Orders ED Discharge Orders    None       Virgel Manifold, MD 06/10/20 [phone]

## 2021-06-09 ENCOUNTER — Other Ambulatory Visit: Payer: Self-pay | Admitting: Internal Medicine

## 2021-06-09 DIAGNOSIS — Z1231 Encounter for screening mammogram for malignant neoplasm of breast: Secondary | ICD-10-CM

## 2021-06-26 ENCOUNTER — Ambulatory Visit: Payer: Self-pay | Admitting: Orthopedic Surgery

## 2021-07-04 ENCOUNTER — Other Ambulatory Visit (HOSPITAL_COMMUNITY): Payer: Self-pay

## 2021-07-08 NOTE — Patient Instructions (Addendum)
DUE TO COVID-19 ONLY ONE VISITOR IS ALLOWED TO COME WITH YOU AND STAY IN THE WAITING ROOM ONLY DURING PRE OP AND PROCEDURE DAY OF SURGERY. THE 1 VISITOR  MAY VISIT WITH YOU AFTER SURGERY IN YOUR PRIVATE ROOM DURING VISITING HOURS ONLY!  YOU NEED TO HAVE A COVID 19 TEST ON: 07/20/21, THIS TEST MUST BE DONE BEFORE SURGERY,  COVID TESTING SITE: [address]. [place]. Phone: [phone]. No appointment needed. Open M - F 8 am - 3 pm. It's a drive-up.               Margaret Cisneros   Your procedure is scheduled on: 07/22/21   Report to [facility] Main  Entrance   Report to admitting at : 6:00 AM     Call this number if you have problems the morning of surgery [phone]    Remember: NO SOLID FOOD AFTER MIDNIGHT THE NIGHT PRIOR TO SURGERY. NOTHING BY MOUTH EXCEPT CLEAR LIQUIDS UNTIL: 5:30 AM . PLEASE FINISH GATORADE DRINK PER SURGEON ORDER  WHICH NEEDS TO BE COMPLETED AT : 5:30 AM.   CLEAR LIQUID DIET  Foods Allowed                                                                     Foods Excluded  Coffee and tea, regular and decaf                             liquids that you cannot  Plain Jell-O any favor except red or purple                                           see through such as: Fruit ices (not with fruit pulp)                                     milk, soups, orange juice  Iced Popsicles                                    All solid food Carbonated beverages, regular and diet                                    Cranberry, grape and apple juices Sports drinks like Gatorade Lightly seasoned clear broth or consume(fat free) Sugar, honey syrup  Sample Menu Breakfast                                Lunch                                     Supper Cranberry juice  Beef broth                            Chicken broth Jell-O                                     Grape juice                           Apple juice Coffee or tea                         Jell-O                                      Popsicle                                                Coffee or tea                        Coffee or tea  _____________________________________________________________________  BRUSH YOUR TEETH MORNING OF SURGERY AND RINSE YOUR MOUTH OUT, NO CHEWING GUM CANDY OR MINTS.    Take these medicines the morning of surgery with A SIP OF WATER: gabapentin,loratadine,omeprazole.Norel,inhalers and flonase as usual.  How to Manage Your Diabetes Before and After Surgery  Why is it important to control my blood sugar before and after surgery? Improving blood sugar levels before and after surgery helps healing and can limit problems. A way of improving blood sugar control is eating a healthy diet by:  Eating less sugar and carbohydrates  Increasing activity/exercise  Talking with your doctor about reaching your blood sugar goals High blood sugars (greater than 180 mg/dL) can raise your risk of infections and slow your recovery, so you will need to focus on controlling your diabetes during the weeks before surgery. Make sure that the doctor who takes care of your diabetes knows about your planned surgery including the date and location.  How do I manage my blood sugar before surgery? Check your blood sugar at least 4 times a day, starting 2 days before surgery, to make sure that the level is not too high or low. Check your blood sugar the morning of your surgery when you wake up and every 2 hours until you get to the Short Stay unit. If your blood sugar is less than 70 mg/dL, you will need to treat for low blood sugar: Do not take insulin. Treat a low blood sugar (less than 70 mg/dL) with  cup of clear juice (cranberry or apple), 4 glucose tablets, OR glucose gel. Recheck blood sugar in 15 minutes after treatment (to make sure it is greater than 70 mg/dL). If your blood sugar is not greater than 70 mg/dL on recheck, call [phone] for further  instructions. Report your blood sugar to the short stay nurse when you get to Short Stay.  If you are admitted to the hospital after surgery: Your blood sugar will be checked by the staff and you will probably be given insulin after surgery (instead of oral diabetes medicines) to make sure you have  good blood sugar levels. The goal for blood sugar control after surgery is 80-180 mg/dL.   WHAT DO I DO ABOUT MY DIABETES MEDICATION?  Do not take oral diabetes medicines (pills) the morning of surgery.  THE DAY BEFORE SURGERY, take metformin as usual. DO NOT take jardiance.      THE MORNING OF SURGERY, DO NOT TAKE ANY DIABETIC MEDICATIONS DAY OF YOUR SURGERY  The day of surgery, do not take other diabetes injectables, including Byetta (exenatide), Bydureon (exenatide ER), Victoza (liraglutide), or Trulicity (dulaglutide).                           You may not have any metal on your body including hair pins and              piercings  Do not wear jewelry, make-up, lotions, powders or perfumes, deodorant             Do not wear nail polish on your fingernails.  Do not shave  48 hours prior to surgery.    Do not bring valuables to the hospital. [place].  Contacts, dentures or bridgework may not be worn into surgery.  Leave suitcase in the car. After surgery it may be brought to your room.     Patients discharged the day of surgery will not be allowed to drive home. IF YOU ARE HAVING SURGERY AND GOING HOME THE SAME DAY, YOU MUST HAVE AN ADULT TO DRIVE YOU HOME AND BE WITH YOU FOR 24 HOURS. YOU MAY GO HOME BY TAXI OR UBER OR ORTHERWISE, BUT AN ADULT MUST ACCOMPANY YOU HOME AND STAY WITH YOU FOR 24 HOURS.  Name and phone number of your driver:  Special Instructions: N/A              Please read over the following fact sheets you were given: _____________________________________________________________________           [facility] - Preparing  for Surgery Before surgery, you can play an important role.  Because skin is not sterile, your skin needs to be as free of germs as possible.  You can reduce the number of germs on your skin by washing with CHG (chlorahexidine gluconate) soap before surgery.  CHG is an antiseptic cleaner which kills germs and bonds with the skin to continue killing germs even after washing. Please DO NOT use if you have an allergy to CHG or antibacterial soaps.  If your skin becomes reddened/irritated stop using the CHG and inform your nurse when you arrive at Short Stay. Do not shave (including legs and underarms) for at least 48 hours prior to the first CHG shower.  You may shave your face/neck. Please follow these instructions carefully:  1.  Shower with CHG Soap the night before surgery and the  morning of Surgery.  2.  If you choose to wash your hair, wash your hair first as usual with your  normal  shampoo.  3.  After you shampoo, rinse your hair and body thoroughly to remove the  shampoo.                           4.  Use CHG as you would any other liquid soap.  You can apply chg directly  to the skin and wash  Gently with a scrungie or clean washcloth.  5.  Apply the CHG Soap to your body ONLY FROM THE NECK DOWN.   Do not use on face/ open                           Wound or open sores. Avoid contact with eyes, ears mouth and genitals (private parts).                       Wash face,  Genitals (private parts) with your normal soap.             6.  Wash thoroughly, paying special attention to the area where your surgery  will be performed.  7.  Thoroughly rinse your body with warm water from the neck down.  8.  DO NOT shower/wash with your normal soap after using and rinsing off  the CHG Soap.                9.  Pat yourself dry with a clean towel.            10.  Wear clean pajamas.            11.  Place clean sheets on your bed the night of your first shower and do not  sleep with  pets. Day of Surgery : Do not apply any lotions/deodorants the morning of surgery.  Please wear clean clothes to the hospital/surgery center.  FAILURE TO FOLLOW THESE INSTRUCTIONS MAY RESULT IN THE CANCELLATION OF YOUR SURGERY PATIENT SIGNATURE_________________________________  NURSE SIGNATURE__________________________________  ________________________________________________________________________   Margaret Cisneros  An incentive spirometer is a tool that can help keep your lungs clear and active. This tool measures how well you are filling your lungs with each breath. Taking long deep breaths may help reverse or decrease the chance of developing breathing (pulmonary) problems (especially infection) following: A long period of time when you are unable to move or be active. BEFORE THE PROCEDURE  If the spirometer includes an indicator to show your best effort, your nurse or respiratory therapist will set it to a desired goal. If possible, sit up straight or lean slightly forward. Try not to slouch. Hold the incentive spirometer in an upright position. INSTRUCTIONS FOR USE  Sit on the edge of your bed if possible, or sit up as far as you can in bed or on a chair. Hold the incentive spirometer in an upright position. Breathe out normally. Place the mouthpiece in your mouth and seal your lips tightly around it. Breathe in slowly and as deeply as possible, raising the piston or the ball toward the top of the column. Hold your breath for 3-5 seconds or for as long as possible. Allow the piston or ball to fall to the bottom of the column. Remove the mouthpiece from your mouth and breathe out normally. Rest for a few seconds and repeat Steps 1 through 7 at least 10 times every 1-2 hours when you are awake. Take your time and take a few normal breaths between deep breaths. The spirometer may include an indicator to show your best effort. Use the indicator as a goal to work toward during each  repetition. After each set of 10 deep breaths, practice coughing to be sure your lungs are clear. If you have an incision (the cut made at the time of surgery), support your incision when coughing by placing a pillow or rolled up towels  firmly against it. Once you are able to get out of bed, walk around indoors and cough well. You may stop using the incentive spirometer when instructed by your caregiver.  RISKS AND COMPLICATIONS Take your time so you do not get dizzy or light-headed. If you are in pain, you may need to take or ask for pain medication before doing incentive spirometry. It is harder to take a deep breath if you are having pain. AFTER USE Rest and breathe slowly and easily. It can be helpful to keep track of a log of your progress. Your caregiver can provide you with a simple table to help with this. If you are using the spirometer at home, follow these instructions: [place] IF:  You are having difficultly using the spirometer. You have trouble using the spirometer as often as instructed. Your pain medication is not giving enough relief while using the spirometer. You develop fever of 100.5 F (38.1 C) or higher. SEEK IMMEDIATE MEDICAL CARE IF:  You cough up bloody sputum that had not been present before. You develop fever of 102 F (38.9 C) or greater. You develop worsening pain at or near the incision site. MAKE SURE YOU:  Understand these instructions. Will watch your condition. Will get help right away if you are not doing well or get worse. Document Released: 04/04/2007 Document Revised: 02/14/2012 Document Reviewed: 06/05/2007 [facility] Patient Information 2014 [place], [place].   ________________________________________________________________________

## 2021-07-09 ENCOUNTER — Encounter (HOSPITAL_COMMUNITY): Payer: Self-pay

## 2021-07-09 ENCOUNTER — Encounter (HOSPITAL_COMMUNITY)
Admission: RE | Admit: 2021-07-09 | Discharge: 2021-07-09 | Disposition: A | Discharge status: Home / Self Care | Payer: Medicare HMO | Source: Ambulatory Visit | Attending: Specialist | Admitting: Specialist

## 2021-07-09 ENCOUNTER — Other Ambulatory Visit: Payer: Self-pay

## 2021-07-09 DIAGNOSIS — Z01818 Encounter for other preprocedural examination: Secondary | ICD-10-CM | POA: Diagnosis present

## 2021-07-09 HISTORY — DX: Type 2 diabetes mellitus without complications: E11.9

## 2021-07-09 LAB — BASIC METABOLIC PANEL
Anion gap: 7 (ref 5–15)
BUN: 19 mg/dL (ref 8–23)
CO2: 27 mmol/L (ref 22–32)
Calcium: 9.1 mg/dL (ref 8.9–10.3)
Chloride: 103 mmol/L (ref 98–111)
Creatinine, Ser: 0.9 mg/dL (ref 0.44–1.00)
GFR, Estimated: >60 mL/min (ref >60)
Glucose, Bld: 125 mg/dL — ABNORMAL HIGH (ref 70–99)
Potassium: 3.6 mmol/L (ref 3.5–5.1)
Sodium: 137 mmol/L (ref 135–145)

## 2021-07-09 LAB — HEMOGLOBIN A1C
Hgb A1c MFr Bld: 6.9 % — ABNORMAL HIGH (ref 4.8–5.6)
Mean Plasma Glucose: 151.33 mg/dL

## 2021-07-09 LAB — SURGICAL PCR SCREEN
MRSA, PCR: NEGATIVE
Staphylococcus aureus: NEGATIVE

## 2021-07-09 LAB — CBC
HCT: 42.9 % (ref 36.0–46.0)
Hemoglobin: 13.1 g/dL (ref 12.0–15.0)
MCH: 24 pg — ABNORMAL LOW (ref 26.0–34.0)
MCHC: 30.5 g/dL (ref 30.0–36.0)
MCV: 78.6 fL — ABNORMAL LOW (ref 80.0–100.0)
Platelets: 268 10*3/uL (ref 150–400)
RBC: 5.46 MIL/uL — ABNORMAL HIGH (ref 3.87–5.11)
RDW: 20.7 % — ABNORMAL HIGH (ref 11.5–15.5)
WBC: 8.5 10*3/uL (ref 4.0–10.5)
nRBC: 0 % (ref 0.0–0.2)

## 2021-07-09 LAB — GLUCOSE, CAPILLARY: Glucose-Capillary: 142 mg/dL — ABNORMAL HIGH (ref 70–99)

## 2021-07-09 NOTE — Progress Notes (Addendum)
COVID Vaccine Completed: Yes Date COVID Vaccine completed: 03/04/20 x 2 COVID vaccine manufacturer: [employer]      PCP - [facility]Clearance: Dr. Tarry Kos [place]: 06/23/21: Chart Cardiologist - NO  Chest x-ray -  EKG -  Stress Test -  ECHO -  Cardiac Cath -  Pacemaker/ICD device last checked:  Sleep Study -  CPAP -   Fasting Blood Sugar - 100's  Checks Blood Sugar ___1__ times a day  Blood Thinner Instructions: Aspirin Instructions: Last Dose:  Anesthesia review:   Patient denies shortness of breath, fever, cough and chest pain at PAT appointment   Patient verbalized understanding of instructions that were given to them at the PAT appointment. Patient was also instructed that they will need to review over the PAT instructions again at home before surgery.

## 2021-07-16 ENCOUNTER — Ambulatory Visit: Payer: Self-pay | Admitting: Orthopedic Surgery

## 2021-07-16 NOTE — H&P (View-Only) (Signed)
Margaret Cisneros is an 70 y.o. female.   Chief Complaint: right knee pain HPI: The patient is here for her H&P. The patient is scheduled for a right total knee replacement by Dr. Tonita Cong at [facility] on 07/22/21.  Dr. Tonita Cong and the patient mutually agreed to proceed with a total knee replacement. Risks and benefits of the procedure were discussed including stiffness, suboptimal range of motion, persistent pain, infection requiring removal of prosthesis and reinsertion, need for prophylactic antibiotics in the future, for example, dental procedures, possible need for manipulation, revision in the future and also anesthetic complications including DVT, PE, etc. We discussed the perioperative course, time in the hospital, postoperative recovery and the need for elevation to control swelling. We also discussed the predicted range of motion and the probability that squatting and kneeling would be unobtainable in the future. In addition, postoperative anticoagulation was discussed. We have obtained preoperative medical clearance as necessary. Provided illustrated handout and discussed it in detail. They will enroll in the total joint replacement educational forum at the hospital.  She has been cleared by her PCP. I have talked to Sonia Side with [facility] and he will arrange home health for her postoperatively.  Past Medical History:  Diagnosis Date   Arthritis    right knee arthritis uses celbrex to treat    Asthma    Diabetes mellitus without complication ([place])    Family history of adverse reaction to anesthesia    sister- nausea and vomiting after surgery   GERD (gastroesophageal reflux disease)    severe    Hypertension     Past Surgical History:  Procedure Laterality Date   JOINT REPLACEMENT     TOTAL HIP ARTHROPLASTY Left 04/06/2018   Procedure: LEFT TOTAL HIP ARTHROPLASTY ANTERIOR APPROACH;  Surgeon: Rod Can, MD;  Location: [facility];  Service: Orthopedics;  Laterality: Left;  Needs RNFA    TOTAL KNEE ARTHROPLASTY Left 02/14/2019   Procedure: TOTAL KNEE ARTHROPLASTY;  Surgeon: Susa Day, MD;  Location: [facility];  Service: Orthopedics;  Laterality: Left;  150 mins   Current Medications: Accu-Chek Guide Glucose Meter Accu-Chek Guide test strips Accu-Chek Softclix Lancets albuterol sulfate HFA 90 mcg/actuation aerosol inhaler CeleBREX Claritin ergocalciferol (vitamin D2) 50 mcg (2,000 unit) tablet Flonase gabapentin 100 mg capsule hydroCHLOROthiazide 25 mg tablet Jardiance 25 mg tablet losartan metFORMIN 500 mg tablet omeprazole 40 mg capsule,delayed release Trulicity  No family history on file. Social History:  reports that she quit smoking about 22 years ago. Her smoking use included cigarettes. She has never used smokeless tobacco. She reports that she does not currently use alcohol. She reports that she does not use drugs.  Allergies: No Known Allergies  (Not in a hospital admission)   No results found for this or any previous visit (from the past 48 hour(s)). No results found.  Review of Systems  Constitutional: Negative.   HENT: Negative.    Eyes: Negative.   Respiratory: Negative.    Cardiovascular: Negative.   Gastrointestinal: Negative.   Endocrine: Negative.   Genitourinary: Negative.   Musculoskeletal:  Positive for arthralgias, gait problem and joint swelling.  Allergic/Immunologic: Negative.    There were no vitals taken for this visit. Physical Exam Constitutional:      Appearance: Normal appearance.  HENT:     Head: Normocephalic and atraumatic.     Right Ear: External ear normal.     Left Ear: External ear normal.     Nose: Nose normal.     Mouth/Throat:  Pharynx: Oropharynx is clear.  Eyes:     Conjunctiva/sclera: Conjunctivae normal.  Cardiovascular:     Rate and Rhythm: Normal rate and regular rhythm.     Pulses: Normal pulses.     Heart sounds: Normal heart sounds.  Pulmonary:     Effort: Pulmonary effort is normal.      Breath sounds: Normal breath sounds.  Abdominal:     General: Bowel sounds are normal.  Musculoskeletal:     Cervical back: Normal range of motion.     Comments: Right knee she is tender medial joint line patellofemoral pain compression. Mild effusion. Ranges -5-100.  No DVT.  Skin:    General: Skin is warm and dry.  Neurological:     Mental Status: She is alert.     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 Tonita Cong on August 17. We again discussed the procedure itself as well as risks, complications and alternatives, including but not limited to DVT, PE, infx, bleeding, failure of procedure, need for secondary procedure including manipulation, nerve injury, ongoing pain/symptoms, anesthesia risk, even stroke or death. Also discussed typical post-op protocols, activity restrictions, need for PT, flexion/extension exercises, time out of work. Discussed need for DVT ppx post-op per protocol. Discussed dental ppx and infx prevention. Also discussed limitations post-operatively such as kneeling and squatting. All questions were answered. Patient desires to proceed with surgery as scheduled.  Will hold supplements, ASA and NSAIDs accordingly. Will remain NPO after midnight the night before surgery. Will present to [facility] for pre-op testing. Anticipate hospital stay to include at least 2 midnights given medical history and to ensure proper pain control. Plan aspirin 81 mg twice a day for DVT ppx post-op. Plan oxycodone, Colace, Miralax. Plan home with HHPT post-op with family members at home for assistance, info given to [place] on 07/14/2021 and he will arrange. Will follow up 10-14 days post-op for suture removal and xrays.  Plan Right total knee replacement  Cecilie Kicks, PA-C for Dr. Tonita Cong 07/16/2021, 1:38  PM

## 2021-07-16 NOTE — H&P (Signed)
Margaret Cisneros is an 70 y.o. female.   Chief Complaint: right knee pain HPI: The patient is here for her H&P. The patient is scheduled for a right total knee replacement by Dr. Tonita Cong at [facility] on 07/22/21.  Dr. Tonita Cong and the patient mutually agreed to proceed with a total knee replacement. Risks and benefits of the procedure were discussed including stiffness, suboptimal range of motion, persistent pain, infection requiring removal of prosthesis and reinsertion, need for prophylactic antibiotics in the future, for example, dental procedures, possible need for manipulation, revision in the future and also anesthetic complications including DVT, PE, etc. We discussed the perioperative course, time in the hospital, postoperative recovery and the need for elevation to control swelling. We also discussed the predicted range of motion and the probability that squatting and kneeling would be unobtainable in the future. In addition, postoperative anticoagulation was discussed. We have obtained preoperative medical clearance as necessary. Provided illustrated handout and discussed it in detail. They will enroll in the total joint replacement educational forum at the hospital.  She has been cleared by her PCP. I have talked to Sonia Side with [facility] and he will arrange home health for her postoperatively.  Past Medical History:  Diagnosis Date   Arthritis    right knee arthritis uses celbrex to treat    Asthma    Diabetes mellitus without complication ([place])    Family history of adverse reaction to anesthesia    sister- nausea and vomiting after surgery   GERD (gastroesophageal reflux disease)    severe    Hypertension     Past Surgical History:  Procedure Laterality Date   JOINT REPLACEMENT     TOTAL HIP ARTHROPLASTY Left 04/06/2018   Procedure: LEFT TOTAL HIP ARTHROPLASTY ANTERIOR APPROACH;  Surgeon: Rod Can, MD;  [facility]: [facility];  Service: Orthopedics;  Laterality: Left;  Needs RNFA    TOTAL KNEE ARTHROPLASTY Left 02/14/2019   Procedure: TOTAL KNEE ARTHROPLASTY;  Surgeon: Susa Day, MD;  Location: [facility];  Service: Orthopedics;  Laterality: Left;  150 mins   Current Medications: Accu-Chek Guide Glucose Meter Accu-Chek Guide test strips Accu-Chek Softclix Lancets albuterol sulfate HFA 90 mcg/actuation aerosol inhaler CeleBREX Claritin ergocalciferol (vitamin D2) 50 mcg (2,000 unit) tablet Flonase gabapentin 100 mg capsule hydroCHLOROthiazide 25 mg tablet Jardiance 25 mg tablet losartan metFORMIN 500 mg tablet omeprazole 40 mg capsule,delayed release Trulicity  No family history on file. Social History:  reports that she quit smoking about 22 years ago. Her smoking use included cigarettes. She has never used smokeless tobacco. She reports that she does not currently use alcohol. She reports that she does not use drugs.  Allergies: No Known Allergies  (Not in a hospital admission)   No results found for this or any previous visit (from the past 48 hour(s)). No results found.  Review of Systems  Constitutional: Negative.   HENT: Negative.    Eyes: Negative.   Respiratory: Negative.    Cardiovascular: Negative.   Gastrointestinal: Negative.   Endocrine: Negative.   Genitourinary: Negative.   Musculoskeletal:  Positive for arthralgias, gait problem and joint swelling.  Allergic/Immunologic: Negative.    There were no vitals taken for this visit. Physical Exam Constitutional:      Appearance: Normal appearance.  HENT:     Head: Normocephalic and atraumatic.     Right Ear: External ear normal.     Left Ear: External ear normal.     Nose: Nose normal.     Mouth/Throat:  Pharynx: Oropharynx is clear.  Eyes:     Conjunctiva/sclera: Conjunctivae normal.  Cardiovascular:     Rate and Rhythm: Normal rate and regular rhythm.     Pulses: Normal pulses.     Heart sounds: Normal heart sounds.  Pulmonary:     Effort: Pulmonary effort is normal.      Breath sounds: Normal breath sounds.  Abdominal:     General: Bowel sounds are normal.  Musculoskeletal:     Cervical back: Normal range of motion.     Comments: Right knee she is tender medial joint line patellofemoral pain compression. Mild effusion. Ranges -5-100.  No DVT.  Skin:    General: Skin is warm and dry.  Neurological:     Mental Status: She is alert.     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 Tonita Cong on August 17. We again discussed the procedure itself as well as risks, complications and alternatives, including but not limited to DVT, PE, infx, bleeding, failure of procedure, need for secondary procedure including manipulation, nerve injury, ongoing pain/symptoms, anesthesia risk, even stroke or death. Also discussed typical post-op protocols, activity restrictions, need for PT, flexion/extension exercises, time out of work. Discussed need for DVT ppx post-op per protocol. Discussed dental ppx and infx prevention. Also discussed limitations post-operatively such as kneeling and squatting. All questions were answered. Patient desires to proceed with surgery as scheduled.  Will hold supplements, ASA and NSAIDs accordingly. Will remain NPO after midnight the night before surgery. Will present to [facility] for pre-op testing. Anticipate hospital stay to include at least 2 midnights given medical history and to ensure proper pain control. Plan aspirin 81 mg twice a day for DVT ppx post-op. Plan oxycodone, Colace, Miralax. Plan home with HHPT post-op with family members at home for assistance, info given to Margaret Cisneros on 07/14/2021 and he will arrange. Will follow up 10-14 days post-op for suture removal and xrays.  Plan Right total knee replacement  Margaret Kicks, PA-C for Dr. Tonita Cong 07/16/2021, 1:38  PM

## 2021-07-20 ENCOUNTER — Other Ambulatory Visit: Payer: Self-pay | Admitting: Specialist

## 2021-07-21 LAB — SARS CORONAVIRUS 2 (TAT 6-24 HRS): SARS Coronavirus 2: NEGATIVE

## 2021-07-21 NOTE — Anesthesia Preprocedure Evaluation (Addendum)
Anesthesia Evaluation  Patient identified by MRN, date of birth, ID band Patient awake    Reviewed: Allergy & Precautions, NPO status , Patient's Chart, lab work & pertinent test results  Airway Mallampati: II  TM Distance: >3 FB Neck ROM: Full    Dental  (+) Dental Advisory Given   Pulmonary asthma , former smoker,    breath sounds clear to auscultation       Cardiovascular hypertension, Pt. on medications  Rhythm:Regular Rate:Normal     Neuro/Psych negative neurological ROS     GI/Hepatic Neg liver ROS, GERD  ,  Endo/Other  diabetes, Type 2, Oral Hypoglycemic Agents  Renal/GU negative Renal ROS     Musculoskeletal  (+) Arthritis ,   Abdominal   Peds  Hematology negative hematology ROS (+)   Anesthesia Other Findings   Reproductive/Obstetrics                             Lab Results  Component Value Date   WBC 8.5 07/09/2021   HGB 13.1 07/09/2021   HCT 42.9 07/09/2021   MCV 78.6 (L) 07/09/2021   PLT 268 07/09/2021   Lab Results  Component Value Date   CREATININE 0.90 07/09/2021   BUN 19 07/09/2021   NA 137 07/09/2021   K 3.6 07/09/2021   CL 103 07/09/2021   CO2 27 07/09/2021    Anesthesia Physical Anesthesia Plan  ASA: 2  Anesthesia Plan: Spinal   Post-op Pain Management:  Regional for Post-op pain   Induction:   PONV Risk Score and Plan: 2 and Propofol infusion, Ondansetron and Treatment may vary due to age or medical condition  Airway Management Planned: Natural Airway and Simple Face Mask  Additional Equipment:   Intra-op Plan:   Post-operative Plan:   Informed Consent: I have reviewed the patients History and Physical, chart, labs and discussed the procedure including the risks, benefits and alternatives for the proposed anesthesia with the patient or authorized representative who has indicated his/her understanding and acceptance.     Dental advisory  given  Plan Discussed with: CRNA  Anesthesia Plan Comments:        Anesthesia Quick Evaluation

## 2021-07-22 ENCOUNTER — Observation Stay (HOSPITAL_COMMUNITY)
Admission: RE | Admit: 2021-07-22 | Discharge: 2021-07-25 | Disposition: A | Discharge status: Home / Self Care | Payer: Medicare HMO | Attending: Specialist | Admitting: Specialist

## 2021-07-22 ENCOUNTER — Ambulatory Visit (HOSPITAL_COMMUNITY): Payer: Medicare HMO

## 2021-07-22 ENCOUNTER — Ambulatory Visit (HOSPITAL_COMMUNITY): Payer: Medicare HMO | Admitting: Physician Assistant

## 2021-07-22 ENCOUNTER — Other Ambulatory Visit (HOSPITAL_COMMUNITY): Payer: Self-pay

## 2021-07-22 ENCOUNTER — Encounter (HOSPITAL_COMMUNITY)
Admission: RE | Disposition: A | Discharge status: Home / Self Care | Payer: Self-pay | Source: Home / Self Care | Attending: Specialist

## 2021-07-22 ENCOUNTER — Ambulatory Visit (HOSPITAL_COMMUNITY): Payer: Medicare HMO | Admitting: Anesthesiology

## 2021-07-22 ENCOUNTER — Encounter (HOSPITAL_COMMUNITY): Payer: Self-pay | Admitting: Specialist

## 2021-07-22 DIAGNOSIS — Z23 Encounter for immunization: Secondary | ICD-10-CM | POA: Insufficient documentation

## 2021-07-22 DIAGNOSIS — Z96659 Presence of unspecified artificial knee joint: Secondary | ICD-10-CM

## 2021-07-22 DIAGNOSIS — Z96652 Presence of left artificial knee joint: Secondary | ICD-10-CM | POA: Insufficient documentation

## 2021-07-22 DIAGNOSIS — Z7984 Long term (current) use of oral hypoglycemic drugs: Secondary | ICD-10-CM | POA: Insufficient documentation

## 2021-07-22 DIAGNOSIS — M21161 Varus deformity, not elsewhere classified, right knee: Secondary | ICD-10-CM | POA: Diagnosis not present

## 2021-07-22 DIAGNOSIS — J45909 Unspecified asthma, uncomplicated: Secondary | ICD-10-CM | POA: Diagnosis not present

## 2021-07-22 DIAGNOSIS — E119 Type 2 diabetes mellitus without complications: Secondary | ICD-10-CM | POA: Insufficient documentation

## 2021-07-22 DIAGNOSIS — Z79899 Other long term (current) drug therapy: Secondary | ICD-10-CM | POA: Diagnosis not present

## 2021-07-22 DIAGNOSIS — M1711 Unilateral primary osteoarthritis, right knee: Secondary | ICD-10-CM | POA: Diagnosis present

## 2021-07-22 DIAGNOSIS — I10 Essential (primary) hypertension: Secondary | ICD-10-CM | POA: Diagnosis not present

## 2021-07-22 DIAGNOSIS — Z96642 Presence of left artificial hip joint: Secondary | ICD-10-CM | POA: Insufficient documentation

## 2021-07-22 DIAGNOSIS — Z87891 Personal history of nicotine dependence: Secondary | ICD-10-CM | POA: Insufficient documentation

## 2021-07-22 HISTORY — PX: TOTAL KNEE ARTHROPLASTY: SHX125

## 2021-07-22 LAB — GLUCOSE, CAPILLARY
Glucose-Capillary: 189 mg/dL — ABNORMAL HIGH (ref 70–99)
Glucose-Capillary: 181 mg/dL — ABNORMAL HIGH (ref 70–99)
Glucose-Capillary: 161 mg/dL — ABNORMAL HIGH (ref 70–99)
Glucose-Capillary: 182 mg/dL — ABNORMAL HIGH (ref 70–99)

## 2021-07-22 SURGERY — ARTHROPLASTY, KNEE, TOTAL
Anesthesia: Spinal | Site: Knee | Laterality: Right

## 2021-07-22 MED ORDER — ASPIRIN EC 81 MG PO TBEC
81.0000 mg | DELAYED_RELEASE_TABLET | Freq: Two times a day (BID) | ORAL | 1 refills | Status: DC
  Filled 2021-07-22: qty 60, 30d supply, fill #0

## 2021-07-22 MED ORDER — PROPOFOL 10 MG/ML IV BOLUS
INTRAVENOUS | Status: DC | PRN
  Administered 2021-07-22: 30 mg via INTRAVENOUS
  Administered 2021-07-22 (×2): 20 mg via INTRAVENOUS

## 2021-07-22 MED ORDER — RISAQUAD PO CAPS
1.0000 | ORAL_CAPSULE | Freq: Every day | ORAL | Status: DC
  Administered 2021-07-22 – 2021-07-25 (×4): 1 via ORAL
  Filled 2021-07-22 (×3): qty 1

## 2021-07-22 MED ORDER — ONDANSETRON HCL 4 MG PO TABS: 4.0000 mg | ORAL_TABLET | Freq: Four times a day (QID) | ORAL | Status: DC | PRN

## 2021-07-22 MED ORDER — HYDROCHLOROTHIAZIDE 25 MG PO TABS
25.0000 mg | ORAL_TABLET | Freq: Every day | ORAL | Status: DC
  Administered 2021-07-23 – 2021-07-25 (×3): 25 mg via ORAL
  Filled 2021-07-22 (×3): qty 1

## 2021-07-22 MED ORDER — METHOCARBAMOL 500 MG IVPB - SIMPLE MED
500.0000 mg | Freq: Four times a day (QID) | INTRAVENOUS | Status: DC | PRN
  Administered 2021-07-22: 500 mg via INTRAVENOUS
  Filled 2021-07-22: qty 50

## 2021-07-22 MED ORDER — CHLORHEXIDINE GLUCONATE 0.12 % MT SOLN
15.0000 mL | Freq: Once | OROMUCOSAL | Status: AC
  Administered 2021-07-22: 15 mL via OROMUCOSAL

## 2021-07-22 MED ORDER — BUPIVACAINE IN DEXTROSE 0.75-8.25 % IT SOLN
INTRATHECAL | Status: DC | PRN
  Administered 2021-07-22: 1.8 mL via INTRATHECAL

## 2021-07-22 MED ORDER — FENTANYL CITRATE (PF) 100 MCG/2ML IJ SOLN
25.0000 ug | INTRAMUSCULAR | Status: DC | PRN
  Administered 2021-07-22 (×3): 50 ug via INTRAVENOUS

## 2021-07-22 MED ORDER — STERILE WATER FOR IRRIGATION IR SOLN
Status: DC | PRN
  Administered 2021-07-22: 2000 mL

## 2021-07-22 MED ORDER — POLYETHYLENE GLYCOL 400 0.25 % OP SOLN: Freq: Every day | OPHTHALMIC | Status: DC | PRN

## 2021-07-22 MED ORDER — POTASSIUM CHLORIDE IN NACL 20-0.45 MEQ/L-% IV SOLN
INTRAVENOUS | Status: DC
  Filled 2021-07-22 (×3): qty 1000

## 2021-07-22 MED ORDER — PROPOFOL 500 MG/50ML IV EMUL
INTRAVENOUS | Status: DC | PRN
  Administered 2021-07-22: 50 ug/kg/min via INTRAVENOUS

## 2021-07-22 MED ORDER — TRANEXAMIC ACID-NACL 1000-0.7 MG/100ML-% IV SOLN
1000.0000 mg | INTRAVENOUS | Status: AC
  Administered 2021-07-22: 1000 mg via INTRAVENOUS
  Filled 2021-07-22: qty 100

## 2021-07-22 MED ORDER — ONDANSETRON HCL 4 MG/2ML IJ SOLN: INTRAMUSCULAR | Status: DC | PRN

## 2021-07-22 MED ORDER — VITAMIN D 25 MCG (1000 UNIT) PO TABS
2000.0000 [IU] | ORAL_TABLET | Freq: Every day | ORAL | Status: DC
  Administered 2021-07-23 – 2021-07-25 (×3): 2000 [IU] via ORAL
  Filled 2021-07-22 (×3): qty 2

## 2021-07-22 MED ORDER — ONDANSETRON HCL 4 MG/2ML IJ SOLN
INTRAMUSCULAR | Status: AC
  Filled 2021-07-22: qty 2

## 2021-07-22 MED ORDER — LACTATED RINGERS IV SOLN: INTRAVENOUS | Status: DC

## 2021-07-22 MED ORDER — BUPIVACAINE-EPINEPHRINE 0.25% -1:200000 IJ SOLN
INTRAMUSCULAR | Status: DC | PRN
  Administered 2021-07-22: 20 mL

## 2021-07-22 MED ORDER — VITAMIN D2 50 MCG (2000 UT) PO TABS: 2000.0000 [IU] | ORAL_TABLET | Freq: Every day | ORAL | Status: DC

## 2021-07-22 MED ORDER — BUPIVACAINE-EPINEPHRINE (PF) 0.25% -1:200000 IJ SOLN
INTRAMUSCULAR | Status: AC
  Filled 2021-07-22: qty 30

## 2021-07-22 MED ORDER — CHLORPHEN-PE-ACETAMINOPHEN 4-10-325 MG PO TABS: 1.0000 | ORAL_TABLET | Freq: Two times a day (BID) | ORAL | Status: DC | PRN

## 2021-07-22 MED ORDER — EMPAGLIFLOZIN 25 MG PO TABS
25.0000 mg | ORAL_TABLET | Freq: Every day | ORAL | Status: DC
  Administered 2021-07-23 – 2021-07-25 (×3): 25 mg via ORAL
  Filled 2021-07-22 (×3): qty 1

## 2021-07-22 MED ORDER — ONDANSETRON HCL 4 MG/2ML IJ SOLN: 4.0000 mg | Freq: Four times a day (QID) | INTRAMUSCULAR | Status: DC | PRN

## 2021-07-22 MED ORDER — IRRISEPT - 450ML BOTTLE WITH 0.05% CHG IN STERILE WATER, USP 99.95% OPTIME
TOPICAL | Status: DC | PRN
  Administered 2021-07-22: 450 mL via TOPICAL

## 2021-07-22 MED ORDER — POLYETHYLENE GLYCOL 3350 17 G PO PACK
17.0000 g | PACK | Freq: Every day | ORAL | 0 refills | Status: DC
  Filled 2021-07-22: qty 14, 14d supply, fill #0

## 2021-07-22 MED ORDER — ACETAMINOPHEN 10 MG/ML IV SOLN
1000.0000 mg | INTRAVENOUS | Status: AC
  Administered 2021-07-22: 1000 mg via INTRAVENOUS
  Filled 2021-07-22: qty 100

## 2021-07-22 MED ORDER — OXYCODONE HCL 5 MG PO TABS
5.0000 mg | ORAL_TABLET | ORAL | 0 refills | Status: DC | PRN
  Filled 2021-07-22: qty 40, 4d supply, fill #0

## 2021-07-22 MED ORDER — PHENOL 1.4 % MT LIQD: 1.0000 | OROMUCOSAL | Status: DC | PRN

## 2021-07-22 MED ORDER — BUPIVACAINE LIPOSOME 1.3 % IJ SUSP
20.0000 mL | Freq: Once | INTRAMUSCULAR | Status: AC
  Administered 2021-07-22: 20 mL
  Filled 2021-07-22: qty 20

## 2021-07-22 MED ORDER — CEFAZOLIN SODIUM-DEXTROSE 2-4 GM/100ML-% IV SOLN
2.0000 g | Freq: Four times a day (QID) | INTRAVENOUS | Status: AC
Start: 2021-07-22 — End: 2021-07-22
  Administered 2021-07-22 (×2): 2 g via INTRAVENOUS
  Filled 2021-07-22 (×2): qty 100

## 2021-07-22 MED ORDER — MENTHOL 3 MG MT LOZG
1.0000 | LOZENGE | OROMUCOSAL | Status: DC | PRN
  Filled 2021-07-22: qty 9

## 2021-07-22 MED ORDER — ORAL CARE MOUTH RINSE: 15.0000 mL | Freq: Once | OROMUCOSAL | Status: AC

## 2021-07-22 MED ORDER — PROPOFOL 10 MG/ML IV BOLUS
INTRAVENOUS | Status: AC
  Filled 2021-07-22: qty 20

## 2021-07-22 MED ORDER — LORATADINE 10 MG PO TABS
10.0000 mg | ORAL_TABLET | Freq: Every day | ORAL | Status: DC
  Administered 2021-07-23 – 2021-07-25 (×3): 10 mg via ORAL
  Filled 2021-07-22 (×3): qty 1

## 2021-07-22 MED ORDER — PROPOFOL 500 MG/50ML IV EMUL
INTRAVENOUS | Status: AC
  Filled 2021-07-22: qty 50

## 2021-07-22 MED ORDER — LOSARTAN POTASSIUM 50 MG PO TABS
100.0000 mg | ORAL_TABLET | Freq: Every day | ORAL | Status: DC
  Administered 2021-07-23 – 2021-07-25 (×3): 100 mg via ORAL
  Filled 2021-07-22 (×3): qty 2

## 2021-07-22 MED ORDER — METHOCARBAMOL 500 MG IVPB - SIMPLE MED
INTRAVENOUS | Status: AC
  Filled 2021-07-22: qty 50

## 2021-07-22 MED ORDER — DOCUSATE SODIUM 100 MG PO CAPS
100.0000 mg | ORAL_CAPSULE | Freq: Two times a day (BID) | ORAL | Status: DC
  Administered 2021-07-22 – 2021-07-25 (×6): 100 mg via ORAL
  Filled 2021-07-22 (×6): qty 1

## 2021-07-22 MED ORDER — FENTANYL CITRATE (PF) 100 MCG/2ML IJ SOLN
INTRAMUSCULAR | Status: AC
  Filled 2021-07-22: qty 2

## 2021-07-22 MED ORDER — FENTANYL CITRATE (PF) 100 MCG/2ML IJ SOLN
INTRAMUSCULAR | Status: DC | PRN
  Administered 2021-07-22: 100 ug via INTRAVENOUS
  Administered 2021-07-22: 25 ug via INTRAVENOUS
  Administered 2021-07-22: 50 ug via INTRAVENOUS
  Administered 2021-07-22: 25 ug via INTRAVENOUS

## 2021-07-22 MED ORDER — GABAPENTIN 100 MG PO CAPS
100.0000 mg | ORAL_CAPSULE | Freq: Three times a day (TID) | ORAL | Status: DC
  Administered 2021-07-22 – 2021-07-25 (×9): 100 mg via ORAL
  Filled 2021-07-22 (×9): qty 1

## 2021-07-22 MED ORDER — DIPHENHYDRAMINE HCL 12.5 MG/5ML PO ELIX: 12.5000 mg | ORAL_SOLUTION | ORAL | Status: DC | PRN

## 2021-07-22 MED ORDER — SODIUM CHLORIDE 0.9 % IR SOLN
Status: DC | PRN
  Administered 2021-07-22: 1000 mL

## 2021-07-22 MED ORDER — DOCUSATE SODIUM 100 MG PO CAPS
100.0000 mg | ORAL_CAPSULE | Freq: Two times a day (BID) | ORAL | 1 refills | Status: DC | PRN
  Filled 2021-07-22: qty 30, 15d supply, fill #0

## 2021-07-22 MED ORDER — PHENYLEPHRINE HCL (PRESSORS) 10 MG/ML IV SOLN
INTRAVENOUS | Status: AC
  Filled 2021-07-22: qty 2

## 2021-07-22 MED ORDER — FLUTICASONE PROPIONATE 50 MCG/ACT NA SUSP
2.0000 | Freq: Every day | NASAL | Status: DC
  Administered 2021-07-23 – 2021-07-25 (×3): 2 via NASAL
  Filled 2021-07-22: qty 16

## 2021-07-22 MED ORDER — SODIUM CHLORIDE 0.9% FLUSH
INTRAVENOUS | Status: DC | PRN
  Administered 2021-07-22: 40 mL

## 2021-07-22 MED ORDER — TRIAMCINOLONE ACETONIDE 0.5 % EX CREA
1.0000 "application " | TOPICAL_CREAM | Freq: Two times a day (BID) | CUTANEOUS | Status: DC | PRN
  Filled 2021-07-22: qty 15

## 2021-07-22 MED ORDER — ACETAMINOPHEN 500 MG PO TABS
1000.0000 mg | ORAL_TABLET | Freq: Four times a day (QID) | ORAL | Status: AC
  Administered 2021-07-22 – 2021-07-23 (×4): 1000 mg via ORAL
  Filled 2021-07-22 (×4): qty 2

## 2021-07-22 MED ORDER — METHOCARBAMOL 500 MG PO TABS
500.0000 mg | ORAL_TABLET | Freq: Four times a day (QID) | ORAL | Status: DC | PRN
  Administered 2021-07-23 – 2021-07-25 (×5): 500 mg via ORAL
  Filled 2021-07-22 (×6): qty 1

## 2021-07-22 MED ORDER — LACTATED RINGERS IV SOLN
INTRAVENOUS | Status: DC
  Administered 2021-07-22: 1000 mL via INTRAVENOUS

## 2021-07-22 MED ORDER — ASPIRIN 81 MG PO CHEW
81.0000 mg | CHEWABLE_TABLET | Freq: Two times a day (BID) | ORAL | Status: DC
  Administered 2021-07-23 – 2021-07-25 (×5): 81 mg via ORAL
  Filled 2021-07-22 (×5): qty 1

## 2021-07-22 MED ORDER — CEFAZOLIN SODIUM-DEXTROSE 2-4 GM/100ML-% IV SOLN
2.0000 g | INTRAVENOUS | Status: AC
  Administered 2021-07-22: 2 g via INTRAVENOUS
  Filled 2021-07-22: qty 100

## 2021-07-22 MED ORDER — DULAGLUTIDE 0.75 MG/0.5ML ~~LOC~~ SOAJ: 0.7500 mg | SUBCUTANEOUS | Status: DC

## 2021-07-22 MED ORDER — 0.9 % SODIUM CHLORIDE (POUR BTL) OPTIME
TOPICAL | Status: DC | PRN
  Administered 2021-07-22: 1000 mL

## 2021-07-22 MED ORDER — HYDROMORPHONE HCL 1 MG/ML IJ SOLN
0.5000 mg | INTRAMUSCULAR | Status: DC | PRN
Start: 2021-07-22 — End: 2021-07-25
  Administered 2021-07-22 – 2021-07-24 (×3): 1 mg via INTRAVENOUS
  Filled 2021-07-22 (×3): qty 1

## 2021-07-22 MED ORDER — POLYETHYLENE GLYCOL 3350 17 G PO PACK
17.0000 g | PACK | Freq: Every day | ORAL | Status: DC | PRN
  Administered 2021-07-24 – 2021-07-25 (×2): 17 g via ORAL
  Filled 2021-07-22 (×2): qty 1

## 2021-07-22 MED ORDER — ONDANSETRON HCL 4 MG/2ML IJ SOLN
INTRAMUSCULAR | Status: DC | PRN
Start: 2021-07-22 — End: 2021-07-22
  Administered 2021-07-22: 4 mg via INTRAVENOUS

## 2021-07-22 MED ORDER — ALBUTEROL SULFATE HFA 108 (90 BASE) MCG/ACT IN AERS: 2.0000 | INHALATION_SPRAY | Freq: Four times a day (QID) | RESPIRATORY_TRACT | Status: DC | PRN

## 2021-07-22 MED ORDER — DEXAMETHASONE SODIUM PHOSPHATE 10 MG/ML IJ SOLN
INTRAMUSCULAR | Status: AC
  Filled 2021-07-22: qty 1

## 2021-07-22 MED ORDER — BISACODYL 5 MG PO TBEC
5.0000 mg | DELAYED_RELEASE_TABLET | Freq: Every day | ORAL | Status: DC | PRN
  Administered 2021-07-25: 5 mg via ORAL
  Filled 2021-07-22: qty 1

## 2021-07-22 MED ORDER — OXYCODONE HCL 5 MG PO TABS
5.0000 mg | ORAL_TABLET | ORAL | Status: DC | PRN
  Filled 2021-07-22 (×2): qty 2

## 2021-07-22 MED ORDER — ALBUTEROL SULFATE (2.5 MG/3ML) 0.083% IN NEBU: 2.5000 mg | INHALATION_SOLUTION | Freq: Four times a day (QID) | RESPIRATORY_TRACT | Status: DC | PRN

## 2021-07-22 MED ORDER — POLYVINYL ALCOHOL 1.4 % OP SOLN
1.0000 [drp] | Freq: Every day | OPHTHALMIC | Status: DC | PRN
  Filled 2021-07-22: qty 15

## 2021-07-22 MED ORDER — ALUM & MAG HYDROXIDE-SIMETH 200-200-20 MG/5ML PO SUSP: 30.0000 mL | ORAL | Status: DC | PRN

## 2021-07-22 MED ORDER — OXYCODONE HCL 5 MG PO TABS
10.0000 mg | ORAL_TABLET | ORAL | Status: DC | PRN
  Administered 2021-07-23: 10 mg via ORAL
  Administered 2021-07-23 – 2021-07-25 (×9): 15 mg via ORAL
  Filled 2021-07-22 (×9): qty 3

## 2021-07-22 MED ORDER — METOCLOPRAMIDE HCL 5 MG PO TABS: 5.0000 mg | ORAL_TABLET | Freq: Three times a day (TID) | ORAL | Status: DC | PRN

## 2021-07-22 MED ORDER — INSULIN ASPART 100 UNIT/ML IJ SOLN
0.0000 [IU] | Freq: Three times a day (TID) | INTRAMUSCULAR | Status: DC
  Administered 2021-07-22 – 2021-07-23 (×2): 3 [IU] via SUBCUTANEOUS
  Administered 2021-07-23: 2 [IU] via SUBCUTANEOUS
  Administered 2021-07-23 – 2021-07-24 (×2): 3 [IU] via SUBCUTANEOUS
  Administered 2021-07-24 – 2021-07-25 (×3): 2 [IU] via SUBCUTANEOUS

## 2021-07-22 MED ORDER — METOCLOPRAMIDE HCL 5 MG/ML IJ SOLN: 5.0000 mg | Freq: Three times a day (TID) | INTRAMUSCULAR | Status: DC | PRN

## 2021-07-22 MED ORDER — DEXAMETHASONE SODIUM PHOSPHATE 10 MG/ML IJ SOLN
INTRAMUSCULAR | Status: DC | PRN
  Administered 2021-07-22: 10 mg via INTRAVENOUS

## 2021-07-22 MED ORDER — MIDAZOLAM HCL 5 MG/5ML IJ SOLN
INTRAMUSCULAR | Status: DC | PRN
  Administered 2021-07-22: 2 mg via INTRAVENOUS

## 2021-07-22 MED ORDER — ACETAMINOPHEN 325 MG PO TABS
325.0000 mg | ORAL_TABLET | Freq: Four times a day (QID) | ORAL | Status: DC | PRN
  Administered 2021-07-24: 650 mg via ORAL
  Filled 2021-07-22: qty 2

## 2021-07-22 MED ORDER — PANTOPRAZOLE SODIUM 40 MG PO TBEC
80.0000 mg | DELAYED_RELEASE_TABLET | Freq: Every day | ORAL | Status: DC
  Administered 2021-07-23 – 2021-07-25 (×3): 80 mg via ORAL
  Filled 2021-07-22 (×3): qty 2

## 2021-07-22 MED ORDER — HEMOSTATIC AGENTS (NO CHARGE) OPTIME
TOPICAL | Status: DC | PRN
  Administered 2021-07-22: 1 via TOPICAL

## 2021-07-22 MED ORDER — LOSARTAN POTASSIUM 50 MG PO TABS: 100.0000 mg | ORAL_TABLET | Freq: Every day | ORAL | Status: DC

## 2021-07-22 MED ORDER — ACETAMINOPHEN 500 MG PO TABS
1000.0000 mg | ORAL_TABLET | Freq: Once | ORAL | Status: DC
  Filled 2021-07-22: qty 2

## 2021-07-22 SURGICAL SUPPLY — 81 items
ATTUNE MED DOME PAT 41 KNEE (Knees) ×2 IMPLANT
ATTUNE PS FEM RT SZ 6 CEM KNEE (Femur) ×2 IMPLANT
ATTUNE PSRP INSR SZ6 6 KNEE (Insert) ×2 IMPLANT
BAG COUNTER SPONGE SURGICOUNT (BAG) ×2 IMPLANT
BAG DECANTER FOR FLEXI CONT (MISCELLANEOUS) IMPLANT
BAG ZIPLOCK 12X15 (MISCELLANEOUS) IMPLANT
BASE TIBIA ATTUNE KNEE SYS SZ6 (Knees) ×1 IMPLANT
BLADE SAW SGTL 11.0X1.19X90.0M (BLADE) ×2 IMPLANT
BLADE SAW SGTL 13.0X1.19X90.0M (BLADE) ×2 IMPLANT
BLADE SURG SZ10 CARB STEEL (BLADE) ×4 IMPLANT
BNDG COHESIVE 4X5 TAN ST LF (GAUZE/BANDAGES/DRESSINGS) IMPLANT
BNDG ELASTIC 4X5.8 VLCR STR LF (GAUZE/BANDAGES/DRESSINGS) ×2 IMPLANT
BNDG ELASTIC 6X5.8 VLCR STR LF (GAUZE/BANDAGES/DRESSINGS) ×2 IMPLANT
CEMENT HV SMART SET (Cement) ×4 IMPLANT
COVER SURGICAL LIGHT HANDLE (MISCELLANEOUS) ×2 IMPLANT
CUFF TOURN SGL QUICK 34 (TOURNIQUET CUFF) ×2
CUFF TRNQT CYL 34X4.125X (TOURNIQUET CUFF) ×1 IMPLANT
DECANTER SPIKE VIAL GLASS SM (MISCELLANEOUS) ×2 IMPLANT
DRAPE INCISE IOBAN 66X45 STRL (DRAPES) IMPLANT
DRAPE ORTHO SPLIT 77X108 STRL (DRAPES) ×4
DRAPE SHEET LG 3/4 BI-LAMINATE (DRAPES) ×4 IMPLANT
DRAPE SURG ORHT 6 SPLT 77X108 (DRAPES) ×2 IMPLANT
DRAPE U-SHAPE 47X51 STRL (DRAPES) ×2 IMPLANT
DRESSING AQUACEL AG SP 3.5X10 (GAUZE/BANDAGES/DRESSINGS) ×1 IMPLANT
DRSG AQUACEL AG ADV 3.5X10 (GAUZE/BANDAGES/DRESSINGS) IMPLANT
DRSG AQUACEL AG SP 3.5X10 (GAUZE/BANDAGES/DRESSINGS) ×2
DRSG TEGADERM 4X4.75 (GAUZE/BANDAGES/DRESSINGS) IMPLANT
DURAPREP 26ML APPLICATOR (WOUND CARE) ×2 IMPLANT
ELECT BLADE TIP CTD 4 INCH (ELECTRODE) ×2 IMPLANT
ELECT REM PT RETURN 15FT ADLT (MISCELLANEOUS) ×2 IMPLANT
EVACUATOR 1/8 PVC DRAIN (DRAIN) IMPLANT
GAUZE SPONGE 2X2 8PLY STRL LF (GAUZE/BANDAGES/DRESSINGS) IMPLANT
GLOVE SRG 8 PF TXTR STRL LF DI (GLOVE) ×1 IMPLANT
GLOVE SURG POLYISO LF SZ7.5 (GLOVE) ×4 IMPLANT
GLOVE SURG POLYISO LF SZ8 (GLOVE) ×4 IMPLANT
GLOVE SURG UNDER POLY LF SZ7.5 (GLOVE) ×2 IMPLANT
GLOVE SURG UNDER POLY LF SZ8 (GLOVE) ×2
GOWN STRL REUS W/TWL XL LVL3 (GOWN DISPOSABLE) ×4 IMPLANT
HANDPIECE INTERPULSE COAX TIP (DISPOSABLE) ×2
HEMOSTAT SPONGE AVITENE ULTRA (HEMOSTASIS) IMPLANT
HOLDER FOLEY CATH W/STRAP (MISCELLANEOUS) ×2 IMPLANT
IMMOBILIZER KNEE 20 (SOFTGOODS) ×2
IMMOBILIZER KNEE 20 THIGH 36 (SOFTGOODS) ×1 IMPLANT
JET LAVAGE IRRISEPT WOUND (IRRIGATION / IRRIGATOR) ×2
KIT TURNOVER KIT A (KITS) ×2 IMPLANT
LAVAGE JET IRRISEPT WOUND (IRRIGATION / IRRIGATOR) ×1 IMPLANT
MANIFOLD NEPTUNE II (INSTRUMENTS) ×2 IMPLANT
NDL SAFETY ECLIPSE 18X1.5 (NEEDLE) IMPLANT
NEEDLE HYPO 18GX1.5 SHARP (NEEDLE)
NS IRRIG 1000ML POUR BTL (IV SOLUTION) ×2 IMPLANT
PACK TOTAL KNEE CUSTOM (KITS) ×2 IMPLANT
PIN DRILL FIX HALF THREAD (BIT) ×2 IMPLANT
PIN STEINMAN FIXATION KNEE (PIN) ×2 IMPLANT
PROTECTOR NERVE ULNAR (MISCELLANEOUS) ×2 IMPLANT
SAW OSC TIP CART 19.5X105X1.3 (SAW) ×2 IMPLANT
SEALER BIPOLAR AQUA 6.0 (INSTRUMENTS) ×2 IMPLANT
SET HNDPC FAN SPRY TIP SCT (DISPOSABLE) ×1 IMPLANT
SPONGE GAUZE 2X2 STER 10/PKG (GAUZE/BANDAGES/DRESSINGS)
SPONGE SURGIFOAM ABS GEL 100 (HEMOSTASIS) ×2 IMPLANT
SPONGE T-LAP 18X18 ~~LOC~~+RFID (SPONGE) ×6 IMPLANT
STAPLER VISISTAT (STAPLE) IMPLANT
STRIP CLOSURE SKIN 1/2X4 (GAUZE/BANDAGES/DRESSINGS) ×4 IMPLANT
SUT BONE WAX W31G (SUTURE) ×2 IMPLANT
SUT MNCRL AB 3-0 PS2 18 (SUTURE) ×2 IMPLANT
SUT MNCRL AB 4-0 PS2 18 (SUTURE) ×2 IMPLANT
SUT STRATAFIX 0 PDS 27 VIOLET (SUTURE) ×2
SUT VIC AB 1 CT1 27 (SUTURE) ×4
SUT VIC AB 1 CT1 27XBRD ANTBC (SUTURE) ×2 IMPLANT
SUT VIC AB 1 CTX 36 (SUTURE)
SUT VIC AB 1 CTX36XBRD ANBCTR (SUTURE) IMPLANT
SUT VIC AB 2-0 CT1 27 (SUTURE) ×6
SUT VIC AB 2-0 CT1 TAPERPNT 27 (SUTURE) ×3 IMPLANT
SUTURE STRATFX 0 PDS 27 VIOLET (SUTURE) ×1 IMPLANT
SYR 3ML LL SCALE MARK (SYRINGE) IMPLANT
SYR 50ML LL SCALE MARK (SYRINGE) IMPLANT
TIBIA ATTUNE KNEE SYS BASE SZ6 (Knees) ×2 IMPLANT
TOWER CARTRIDGE SMART MIX (DISPOSABLE) ×2 IMPLANT
TRAY FOLEY MTR SLVR 14FR STAT (SET/KITS/TRAYS/PACK) ×2 IMPLANT
WATER STERILE IRR 1000ML POUR (IV SOLUTION) ×4 IMPLANT
WIPE CHG CHLORHEXIDINE 2% (PERSONAL CARE ITEMS) ×2 IMPLANT
WRAP KNEE MAXI GEL POST OP (GAUZE/BANDAGES/DRESSINGS) ×2 IMPLANT

## 2021-07-22 NOTE — Anesthesia Procedure Notes (Signed)
Date/Time: 07/22/2021 8:35 AM Performed by: Sharlette Dense, CRNA Oxygen Delivery Method: Simple face mask

## 2021-07-22 NOTE — Transfer of Care (Signed)
Immediate Anesthesia Transfer of Care Note  Patient: Margaret Cisneros  Procedure(s) Performed: TOTAL KNEE ARTHROPLASTY (Right: Knee)  Patient Location: PACU  Anesthesia Type:Spinal and MAC combined with regional for post-op pain  Level of Consciousness: awake, alert , oriented and patient cooperative  Airway & Oxygen Therapy: Patient Spontanous Breathing and Patient connected to nasal cannula oxygen  Post-op Assessment: Report given to RN and Post -op Vital signs reviewed and stable  Post vital signs: Reviewed and stable  Last Vitals:  Vitals Value Taken Time  BP 126/86 07/22/21 1200  Temp 36.8 C 07/22/21 1200  Pulse 95 07/22/21 1204  Resp 14 07/22/21 1204  SpO2 94 % 07/22/21 1204  Vitals shown include unvalidated device data.  Last Pain:  Vitals:   07/22/21 0711  TempSrc: Oral  PainSc:          Complications: No notable events documented.

## 2021-07-22 NOTE — Op Note (Signed)
NAME: Margaret Cisneros, WESTHOFF MEDICAL RECORD NO: [id number] ACCOUNT NO: [id number] DATE OF BIRTH: 17-Feb-1951 FACILITY: [facility] LOCATION: [facility] PHYSICIAN: Johnn Hai, MD  Operative Report   DATE OF PROCEDURE: 07/22/2021  PREOPERATIVE DIAGNOSIS:  End-stage osteoarthrosis, varus deformity of the right knee.  POSTOPERATIVE DIAGNOSIS:  End-stage osteoarthrosis, varus deformity of the right knee.  PROCEDURE PERFORMED:  Right total knee arthroplasty utilizing DePuy Attune rotating platform 6 femur, 6 tibia, 6 insert, 41 patella.  ANESTHESIA:  Spinal.  ASSISTANT:  Nehemiah Massed, PA.  HISTORY:  A 70 year old with end-stage osteoarthrosis bone-on-bone medial compartment, varus deformity, indicated for replacement of the degenerated joint.  Risks and benefits were discussed including bleeding, infection, damage to neurovascular  structures, suboptimal range of motion, DVT, PE, anesthetic complications, etc.  DESCRIPTION OF PROCEDURE:  With the patient in supine position.  After induction of adequate spinal anesthesia, the right lower extremity was prepped and draped and exsanguinated in the usual sterile fashion.  Thigh tourniquet inflated to 225 mmHg.   Midline incision was then made over the knee.  Full thickness flaps developed.  Median parapatellar arthrotomy was performed, elevated soft tissues medially, preserving the MCL.  Patella was everted and knee flexed.  Tricompartmental osteoarthrosis was  noted.  Bone-on-bone medial compartment, patellofemoral joint.  I removed remnants of the medial and lateral meniscus and the ACL.  I also removed osteophytes from the distal femur and patella.  A notch was placed above the femoral notch and I used a  step drill to enter the femoral canal in line with the femur.  This was then irrigated, used T-handle and then intramedullary guide, 5-degree right 10 off the distal femur.  This was then pinned.  I performed a distal femoral cut.  I sized the femur to a   6 off the anterior cortex.  This was then pinned in 3 degrees of external rotation and placed a distal cutting block and performed anterior, posterior and chamfer cuts.  With soft tissues protected posteriorly at all times with a curved Crego.  No  notching was noted.  I then subluxed the tibia, cauterized geniculates, removed any remnants of the menisci.  It was low point with bone-on-bone was medial.  Two off the defect was medial.  External alignment guide parallel to the shaft bisecting the  tibiotalar joint 3 degrees slope.  This was then pinned.  I performed the tibial cut protecting the popliteus and soft tissues at all times.  Then, using extension block a 5 slight hyperextension, 6 slightly tight.  Checked posteriorly, capsule was  intact.  Posterolaterally there is some small venous bleeding.  This was cauterized with the Aquamantys and Gelfoam placed there.  I then flexed the knee, subluxed the tibia, sized the tibia to a 6 just to the medial aspect of the tibial tubercle and  maximal coverage.  This was then pinned, harvested bone centrally and impacted into the femoral canal.  I then drilled it centrally and then placed our thin guide, which was impacted into place.  I then placed a trial femur, which fit satisfactorily and  a 5 insert, which was reduced easily and I had full extension, full flexion, good stability to varus valgus stressing at 0 and 30 degrees, slight drawer.  I drilled our lug holes.  Everted the patella, measured it to a 24 planed it to a 15 utilizing the  patellar guide.  A residual was 15.  I then selected a 41 paddle parallel to the joint surface, drilled  our patellar holes.  The trial patella was placed and was reduced and I had excellent patellofemoral tracking.  All trials were removed.  I checked  posteriorly.  Popliteus was intact.  I pulsatile lavage and cleaned the joint.  The knee was flexed.  All trials were then removed after pulsatile lavage.  Surfaces were  thoroughly dried.  I drilled sclerotic bone medially.  Mixed cement on back table in  appropriate fashion under vacuum.  This was then injected into the tibial canal, digitally pressurizing it.  Cement was placed on the tibial tray.  The tibial tray was inserted and impacted.  Redundant cement was removed.  The femur was also cemented  and impacted.  Redundant cement removed.  I placed a 5 insert and held axial load throughout the curing of the cement, Redundant cement removed.  I cemented and clamped the patella.  I then placed Gelfoam in the posterolateral corner where there was some  previous venous bleeding.  I then placed Marcaine with epinephrine into the joint.  Covered the wound and then after appropriate curing of the cement, the tourniquet was deflated at 73 minutes.  Any minor bleeding was cauterized.  Bone wax on the  cancellous surfaces.  I then flexed the knee with slight drawer.  Slight recurvatum.  I then chose a 6 insert.  After all redundant cement was removed and I copiously irrigated the surfaces.  We then placed a 6 permanent insert and reduced the knee and  had good full flexion, extension, and good stability varus and valgus stressing 0-30 degrees.  Negative anterior drawer.  Excellent patellofemoral tracking.  I placed a small piece of Gelfoam on the posterolateral corner, which was not bleeding at this  point in time.  With the knee in slight flexion, we reapproximated the patellar arthrotomy with #1 Vicryl in interrupted figure-of-eight sutures.  Then, it was closed with a running Stratafix.  We had flexion to gravity at 90 degrees.  Negative anterior  drawer and good patellofemoral tracking, subcutaneous with 0 and 2-0, skin with Monocryl.  Sterile dressing was applied.  A knee immobilizer, transported to the recovery room in satisfactory condition.  The patient tolerated the procedure well.  No complications.  Assistant Nehemiah Massed, [place], who was needed throughout the case for  holding of the extremity, exposure, closure.   PUS D: 07/22/2021 11:21:44 am T: 07/22/2021 1:23:00 pm  JOB: [id number]

## 2021-07-22 NOTE — Anesthesia Procedure Notes (Signed)
Spinal  Patient location during procedure: OR Start time: 07/22/2021 8:33 AM End time: 07/22/2021 8:38 AM Reason for block: surgical anesthesia Staffing Performed: anesthesiologist  Anesthesiologist: Suzette Battiest, MD Preanesthetic Checklist Completed: patient identified, IV checked, site marked, risks and benefits discussed, surgical consent, monitors and equipment checked, pre-op evaluation and timeout performed Spinal Block Patient position: sitting Prep: DuraPrep Patient monitoring: heart rate, cardiac monitor, continuous pulse ox and blood pressure Approach: midline Location: L3-4 Injection technique: single-shot Needle Needle type: Pencan  Needle gauge: 24 G Needle length: 9 cm Assessment Sensory level: T4 Events: CSF return Additional Notes Aspiration before and after injection LA

## 2021-07-22 NOTE — Anesthesia Postprocedure Evaluation (Signed)
Anesthesia Post Note  Patient: TAIYANA RETANA  Procedure(s) Performed: TOTAL KNEE ARTHROPLASTY (Right: Knee)     Patient location during evaluation: PACU Anesthesia Type: Spinal Level of consciousness: awake and alert Pain management: pain level controlled Vital Signs Assessment: post-procedure vital signs reviewed and stable Respiratory status: spontaneous breathing and respiratory function stable Cardiovascular status: blood pressure returned to baseline and stable Postop Assessment: spinal receding Anesthetic complications: no   No notable events documented.  Last Vitals:  Vitals:   07/22/21 1315 07/22/21 1653  BP: (!) 145/92 (!) 156/94  Pulse: 78 77  Resp: 13 17  Temp: 36.7 C (!) 36.4 C  SpO2: 96% 97%    Last Pain:  Vitals:   07/22/21 1831  TempSrc:   PainSc: 6                  Tiajuana Amass

## 2021-07-22 NOTE — Evaluation (Signed)
Physical Therapy Evaluation Patient Details Name: Margaret Cisneros MRN: [id number] DOB: [birth year] Today's Date: 07/22/2021   History of Present Illness  Patient is 70 y.o. female s/p Rt TKA on 07/22/21 with PMH significant for OA, asthma, DM, GERD, HTN, Lt TKA in 2020, Lt THA in 2019.    Clinical Impression  Margaret Cisneros is a 70 y.o. female POD 0 s/p Rt TKA. Patient reports independence with mobility at baseline. Patient is now limited by functional impairments (see PT problem list below) and requires min assist for bed mobility and transfers with RW. Patient was able to take small steps forward and turn to move bed>chair with RW and min assist. Further gait deferred due to pain. Patient instructed in exercise to facilitate ROM and circulation to manage edema and reduce risk of DVT. Patient will benefit from continued skilled PT interventions to address impairments and progress towards PLOF. Acute PT will follow to progress mobility and stair training in preparation for safe discharge home.     Follow Up Recommendations Follow surgeon's recommendation for DC plan and follow-up therapies;Home health PT    Equipment Recommendations  3in1 (PT)    Recommendations for Other Services       Precautions / Restrictions Precautions Precautions: Fall Restrictions Weight Bearing Restrictions: No Other Position/Activity Restrictions: WBAT      Mobility  Bed Mobility Overal bed mobility: Needs Assistance Bed Mobility: Supine to Sit     Supine to sit: Min assist;HOB elevated     General bed mobility comments: cues to use bed rail to raise trunk, assist for Rt LE off EOB    Transfers Overall transfer level: Needs assistance Equipment used: Rolling walker (2 wheeled) Transfers: Sit to/from Stand Sit to Stand: Min assist            Ambulation/Gait     Assistive device: Rolling walker (2 wheeled) Gait Pattern/deviations: Step-to pattern;Decreased stride length;Decreased weight  shift to right Gait velocity: decr      Stairs            Wheelchair Mobility    Modified Rankin (Stroke Patients Only)       Balance Overall balance assessment: Needs assistance Sitting-balance support: Feet supported Sitting balance-Leahy Scale: Good     Standing balance support: During functional activity;Bilateral upper extremity supported Standing balance-Leahy Scale: Fair                               Pertinent Vitals/Pain Pain Assessment: 0-10 Pain Score: 10-Worst pain ever Pain Location: Rt knee Pain Descriptors / Indicators: Aching Pain Intervention(s): Limited activity within patient's tolerance;Monitored during session;Repositioned;Ice applied    Home Living Family/patient expects to be discharged to:: Private residence Living Arrangements: Children Available Help at Discharge: Family Type of Home: Apartment Home Access: Stairs to enter Entrance Stairs-Rails: Left (going down) Entrance Stairs-Number of Steps: down a flight (14 steps) Home Layout: One level Home Equipment: Environmental consultant - 2 wheels Additional Comments: pt lives with her son    Prior Function Level of Independence: Independent               Hand Dominance   Dominant Hand: Right    Extremity/Trunk Assessment   Upper Extremity Assessment Upper Extremity Assessment: Overall WFL for tasks assessed    Lower Extremity Assessment Lower Extremity Assessment: RLE deficits/detail RLE Deficits / Details: good quad activation, no extensor lag with SLR. RLE Sensation: WNL RLE Coordination: WNL  Cervical / Trunk Assessment Cervical / Trunk Assessment: Normal  Communication   Communication: No difficulties  Cognition Arousal/Alertness: Awake/alert Behavior During Therapy: WFL for tasks assessed/performed Overall Cognitive Status: Within Functional Limits for tasks assessed                                        General Comments      Exercises      Assessment/Plan    PT Assessment Patient needs continued PT services  PT Problem List Decreased strength;Decreased range of motion;Decreased activity tolerance;Decreased balance;Decreased mobility;Decreased knowledge of use of DME;Decreased knowledge of precautions;Pain       PT Treatment Interventions DME instruction;Gait training;Stair training;Functional mobility training;Therapeutic activities;Balance training;Therapeutic exercise;Patient/family education    PT Goals (Current goals can be found in the Care Plan section)  Acute Rehab PT Goals Patient Stated Goal: stop hurting and get independent again PT Goal Formulation: With patient Time For Goal Achievement: 07/29/21 Potential to Achieve Goals: Good    Frequency 7X/week   Barriers to discharge        Co-evaluation               AM-PAC PT "6 Clicks" Mobility  Outcome Measure Help needed turning from your back to your side while in a flat bed without using bedrails?: A Little Help needed moving from lying on your back to sitting on the side of a flat bed without using bedrails?: A Little Help needed moving to and from a bed to a chair (including a wheelchair)?: A Little Help needed standing up from a chair using your arms (e.g., wheelchair or bedside chair)?: A Little Help needed to walk in hospital room?: A Little Help needed climbing 3-5 steps with a railing? : A Lot 6 Click Score: 17    End of Session Equipment Utilized During Treatment: Gait belt Activity Tolerance: Patient tolerated treatment well;Patient limited by pain Patient left: in chair;with call bell/phone within reach;with chair alarm set;with family/visitor present Nurse Communication: Mobility status PT Visit Diagnosis: Muscle weakness (generalized) (M62.81);Difficulty in walking, not elsewhere classified (R26.2)    Time: [id number] PT Time Calculation (min) (ACUTE ONLY): 17 min   Charges:   PT Evaluation $PT Eval Low Complexity: 1 Low           Verner Mould, DPT Acute Rehabilitation Services Office [phone] Pager [phone]   Jacques Navy 07/22/2021, 4:49 PM

## 2021-07-22 NOTE — Interval H&P Note (Signed)
History and Physical Interval Note:  07/22/2021 8:13 AM  Margaret Cisneros  has presented today for surgery, with the diagnosis of Right knee degenerative joint disease.  The various methods of treatment have been discussed with the patient and family. After consideration of risks, benefits and other options for treatment, the patient has consented to  Procedure(s): TOTAL KNEE ARTHROPLASTY (Right) as a surgical intervention.  The patient's history has been reviewed, patient examined, no change in status, stable for surgery.  I have reviewed the patient's chart and labs.  Questions were answered to the patient's satisfaction.     Johnn Hai

## 2021-07-22 NOTE — Brief Op Note (Signed)
07/22/2021  11:13 AM  PATIENT:  Lester Kinsman  70 y.o. female  PRE-OPERATIVE DIAGNOSIS:  Right knee degenerative joint disease  POST-OPERATIVE DIAGNOSIS:  Right knee degenerative joint disease  PROCEDURE:  Procedure(s): TOTAL KNEE ARTHROPLASTY (Right)  SURGEON:  Surgeon(s) and Role:    Susa Day, MD - Primary  PHYSICIAN ASSISTANT:   ASSISTANTS: Nehemiah Massed    ANESTHESIA:   spinal  EBL:  50 mL   BLOOD ADMINISTERED:none  DRAINS: none   LOCAL MEDICATIONS USED:  MARCAINE     SPECIMEN:  No Specimen  DISPOSITION OF SPECIMEN:  N/A  COUNTS:  YES  TOURNIQUET:   Total Tourniquet Time Documented: Thigh (Right) - 73 minutes Total: Thigh (Right) - 73 minutes   DICTATION: .Other Dictation: Dictation Number [id number]  PLAN OF CARE: Admit for overnight observation  PATIENT DISPOSITION:  PACU - hemodynamically stable.   Delay start of Pharmacological VTE agent (>24hrs) due to surgical blood loss or risk of bleeding: no

## 2021-07-22 NOTE — Anesthesia Procedure Notes (Signed)
Anesthesia Regional Block: Adductor canal block   Pre-Anesthetic Checklist: , timeout performed,  Correct Patient, Correct Site, Correct Laterality,  Correct Procedure, Correct Position, site marked,  Risks and benefits discussed,  Surgical consent,  Pre-op evaluation,  At surgeon's request and post-op pain management  Laterality: Right  Prep: chloraprep       Needles:  Injection technique: Single-shot  Needle Type: Echogenic Needle     Needle Length: 9cm  Needle Gauge: 21     Additional Needles:   Procedures:,,,, ultrasound used (permanent image in chart),,    Narrative:  Start time: 07/22/2021 7:57 AM End time: 07/22/2021 8:02 AM Injection made incrementally with aspirations every 5 mL.  Performed by: Personally  Anesthesiologist: Suzette Battiest, MD

## 2021-07-23 ENCOUNTER — Encounter (HOSPITAL_COMMUNITY): Payer: Self-pay | Admitting: Specialist

## 2021-07-23 ENCOUNTER — Other Ambulatory Visit: Payer: Self-pay

## 2021-07-23 ENCOUNTER — Other Ambulatory Visit (HOSPITAL_COMMUNITY): Payer: Self-pay

## 2021-07-23 DIAGNOSIS — M1711 Unilateral primary osteoarthritis, right knee: Secondary | ICD-10-CM | POA: Diagnosis not present

## 2021-07-23 LAB — GLUCOSE, CAPILLARY
Glucose-Capillary: 176 mg/dL — ABNORMAL HIGH (ref 70–99)
Glucose-Capillary: 135 mg/dL — ABNORMAL HIGH (ref 70–99)
Glucose-Capillary: 157 mg/dL — ABNORMAL HIGH (ref 70–99)
Glucose-Capillary: 122 mg/dL — ABNORMAL HIGH (ref 70–99)

## 2021-07-23 LAB — CBC
HCT: 40.8 % (ref 36.0–46.0)
Hemoglobin: 12.5 g/dL (ref 12.0–15.0)
MCH: 24.1 pg — ABNORMAL LOW (ref 26.0–34.0)
MCHC: 30.6 g/dL (ref 30.0–36.0)
MCV: 78.8 fL — ABNORMAL LOW (ref 80.0–100.0)
Platelets: 268 10*3/uL (ref 150–400)
RBC: 5.18 MIL/uL — ABNORMAL HIGH (ref 3.87–5.11)
RDW: 19.9 % — ABNORMAL HIGH (ref 11.5–15.5)
WBC: 14.3 10*3/uL — ABNORMAL HIGH (ref 4.0–10.5)
nRBC: 0 % (ref 0.0–0.2)

## 2021-07-23 LAB — BASIC METABOLIC PANEL
Anion gap: 7 (ref 5–15)
BUN: 17 mg/dL (ref 8–23)
CO2: 28 mmol/L (ref 22–32)
Calcium: 9.3 mg/dL (ref 8.9–10.3)
Chloride: 103 mmol/L (ref 98–111)
Creatinine, Ser: 0.79 mg/dL (ref 0.44–1.00)
GFR, Estimated: >60 mL/min (ref >60)
Glucose, Bld: 142 mg/dL — ABNORMAL HIGH (ref 70–99)
Potassium: 4.1 mmol/L (ref 3.5–5.1)
Sodium: 138 mmol/L (ref 135–145)

## 2021-07-23 MED ORDER — PNEUMOCOCCAL VAC POLYVALENT 25 MCG/0.5ML IJ INJ
0.5000 mL | INJECTION | INTRAMUSCULAR | Status: AC
  Administered 2021-07-24: 0.5 mL via INTRAMUSCULAR
  Filled 2021-07-23: qty 0.5

## 2021-07-23 NOTE — Progress Notes (Signed)
Physical Therapy Treatment Patient Details Name: Margaret Cisneros MRN: [id number] DOB: 1951/01/30 Today's Date: 07/23/2021    History of Present Illness Patient is 70 y.o. female s/p Rt TKA on 07/22/21 with PMH significant for OA, asthma, DM, GERD, HTN, Lt TKA in 2020, Lt THA in 2019.    PT Comments    Patient continues to progress well with acute PT. Pt educated on use of gait belt to assist Rt LE mobility for bed transfers and no assist needed to complete. Pt required min guard for transfers and demonstrated good carryover for technique to stand with RW. Pt initiated stair training with bil hand rails for support, no overt LOB noted and pt verbalized safe guarding for family to provide. Patient will continue to benefit from skilled PT interventions to progress mobility to safe level for discharge home.   Follow Up Recommendations  Follow surgeon's recommendation for DC plan and follow-up therapies;Home health PT     Equipment Recommendations  3in1 (PT)    Recommendations for Other Services       Precautions / Restrictions Precautions Precautions: Fall Restrictions Weight Bearing Restrictions: No Other Position/Activity Restrictions: WBAT    Mobility  Bed Mobility Overal bed mobility: Needs Assistance Bed Mobility: Supine to Sit;Sit to Supine     Supine to sit: Min guard;HOB elevated Sit to supine: Min guard   General bed mobility comments: cues to use belt to assist Rt LE off EOB and pt able to raise Rt LE onto bed without belt.    Transfers Overall transfer level: Needs assistance Equipment used: Rolling walker (2 wheeled) Transfers: Sit to/from Stand Sit to Stand: Min guard         General transfer comment: pt with good recall for hand placement, guarding for safety with rise from EOB and toilet.  Ambulation/Gait Ambulation/Gait assistance: Min assist Gait Distance (Feet): 60 Feet Assistive device: Rolling walker (2 wheeled) Gait Pattern/deviations: Step-to  pattern;Decreased stride length;Decreased weight shift to right Gait velocity: decr   General Gait Details: pt continues to require cues for step pattern and proximity to RW, continues to move walker further ahead than is safe. no buckling or overt LOB noted throughout.   Stairs Stairs: Yes Stairs assistance: Min assist Stair Management: Two rails;Step to pattern;Forwards Number of Stairs: 4 General stair comments: cues for step sequence "up with good, down with bad, no overt LOB noted. pt completed 2 bouts for 2 steps.   Wheelchair Mobility    Modified Rankin (Stroke Patients Only)       Balance Overall balance assessment: Needs assistance Sitting-balance support: Feet supported Sitting balance-Leahy Scale: Good     Standing balance support: During functional activity;Bilateral upper extremity supported Standing balance-Leahy Scale: Fair                              Cognition Arousal/Alertness: Awake/alert Behavior During Therapy: WFL for tasks assessed/performed Overall Cognitive Status: Within Functional Limits for tasks assessed                                           General Comments        Pertinent Vitals/Pain Pain Assessment: 0-10 Pain Score: 6  Pain Location: Rt knee Pain Descriptors / Indicators: Aching Pain Intervention(s): Limited activity within patient's tolerance;Monitored during session;Repositioned    Home Living  Prior Function            PT Goals (current goals can now be found in the care plan section) Acute Rehab PT Goals Patient Stated Goal: stop hurting and get independent again PT Goal Formulation: With patient Time For Goal Achievement: 07/29/21 Potential to Achieve Goals: Good Progress towards PT goals: Progressing toward goals    Frequency    7X/week      PT Plan Current plan remains appropriate    Co-evaluation              AM-PAC PT "6 Clicks"  Mobility   Outcome Measure  Help needed turning from your back to your side while in a flat bed without using bedrails?: A Little Help needed moving from lying on your back to sitting on the side of a flat bed without using bedrails?: A Little Help needed moving to and from a bed to a chair (including a wheelchair)?: A Little Help needed standing up from a chair using your arms (e.g., wheelchair or bedside chair)?: A Little Help needed to walk in hospital room?: A Little Help needed climbing 3-5 steps with a railing? : A Little 6 Click Score: 18    End of Session Equipment Utilized During Treatment: Gait belt Activity Tolerance: Patient tolerated treatment well;Patient limited by pain Patient left: in chair;with call bell/phone within reach;with chair alarm set;with family/visitor present Nurse Communication: Mobility status PT Visit Diagnosis: Muscle weakness (generalized) (M62.81);Difficulty in walking, not elsewhere classified (R26.2)     Time: 1421-1450 PT Time Calculation (min) (ACUTE ONLY): 29 min  Charges:  $Gait Training: 8-22 mins $Therapeutic Activity: 8-22 mins                     Verner Mould, DPT Acute Rehabilitation Services Office [phone] Pager [phone]    Jacques Navy 07/23/2021, 2:57 PM

## 2021-07-23 NOTE — TOC Transition Note (Signed)
Transition of Care [facility]) - CM/SW Discharge Note  Patient Details  Name: Margaret Cisneros MRN: [id number] Date of Birth: September 03, 1951  Transition of Care [facility]) CM/SW Contact:  Sherie Don, LCSW Phone Number: 07/23/2021, 10:06 AM  Clinical Narrative: Patient is expected to discharge home after working with PT. CSW met with patient to review discharge plan and needs. Patient will discharge home with HHPT through [facility] that was prearranged before surgery. Patient has a rolling walker at home, but will need a 3N1. [employer] to deliver 3N1 to patient's room. TOC signing off.  Final next level of care: [place] Barriers to Discharge: No Barriers Identified  Patient Goals and CMS Choice Patient states their goals for this hospitalization and ongoing recovery are:: Discharge home with [place] CMS Medicare.gov Compare Post Acute Care list provided to:: Patient Choice offered to / list presented to : Patient  Discharge Plan and Services        DME Arranged: 3-N-1 DME Agency: [employer] Date DME Agency Contacted: 07/23/21 Representative spoke with at DME Agency: [place]: PT [place]: [place] Representative spoke with at [place]: Pre-arranged in orthopedist's office  Readmission Risk Interventions No flowsheet data found.

## 2021-07-23 NOTE — Progress Notes (Addendum)
Subjective: 1 Day Post-Op Procedure(s) (LRB): TOTAL KNEE ARTHROPLASTY (Right) Patient reports pain as 3 on 0-10 scale.   Denies CP or SOB.  Voiding without difficulty. Positive flatus. Objective: Vital signs in last 24 hours: Temp:  [97.5 F (36.4 C)-98.3 F (36.8 C)] 97.7 F (36.5 C) (08/18 1009) Pulse Rate:  [77-107] 86 (08/18 1009) Resp:  [13-25] 16 (08/18 1009) BP: (124-156)/(78-101) 133/78 (08/18 1009) SpO2:  [93 %-99 %] 99 % (08/18 1009) Weight:  [110.2 kg] 110.2 kg (08/18 0300)  Intake/Output from previous day: 08/17 0701 - 08/18 0700 In: 3093.3 [P.O.:240; I.V.:2753.3; IV Piggyback:100] Out: 4500 [Urine:4450; Blood:50] Intake/Output this shift: Total I/O In: 520 [P.O.:520] Out: 200 [Urine:200]  Recent Labs    07/23/21 0406  HGB 12.5   Recent Labs    07/23/21 0406  WBC 14.3*  RBC 5.18*  HCT 40.8  PLT 268   Recent Labs    07/23/21 0406  NA 138  K 4.1  CL 103  CO2 28  BUN 17  CREATININE 0.79  GLUCOSE 142*  CALCIUM 9.3   No results for input(s): LABPT, INR in the last 72 hours.  Neurologically intact Neurovascular intact Sensation intact distally Intact pulses distally Incision: dressing C/D/I  Assessment/Plan:  1 Day Post-Op Procedure(s) (LRB): TOTAL KNEE ARTHROPLASTY (Right) Advance diet Up with therapy Plan for discharge tomorrow TEDS Ankle pumps encouraged.  Active Problems:   Right knee DJD      Johnn Hai 07/23/2021, '@NOW'$ 

## 2021-07-23 NOTE — Progress Notes (Signed)
Physical Therapy Treatment Patient Details Name: Margaret Cisneros MRN: [id number] DOB: 05-Feb-1951 Today's Date: 07/23/2021    History of Present Illness Patient is 70 y.o. female s/p Rt TKA on 07/22/21 with PMH significant for OA, asthma, DM, GERD, HTN, Lt TKA in 2020, Lt THA in 2019.    PT Comments    Patient progressing mobility well and OOB to recliner at start of session. Min assist with cues for technique with power up to rise. Pt ambulated ~100' with min assist/guard and cues for sequencing step pattern and walker position. No buckling at Rt knee and no LOB noted. Pt able to doff/don briefs in bathroom while seated on toilet. Educated on LE exercises for ROM and strength. Pt will continue to benefit from skilled PT interventions to progress mobility in preparation for discharge home.    Follow Up Recommendations  Follow surgeon's recommendation for DC plan and follow-up therapies;Home health PT     Equipment Recommendations  3in1 (PT)    Recommendations for Other Services       Precautions / Restrictions Precautions Precautions: Fall Restrictions Weight Bearing Restrictions: No Other Position/Activity Restrictions: WBAT    Mobility  Bed Mobility Overal bed mobility: Needs Assistance             General bed mobility comments: pt OOB in recliner    Transfers Overall transfer level: Needs assistance Equipment used: Rolling walker (2 wheeled) Transfers: Sit to/from Stand Sit to Stand: Min assist;Min guard         General transfer comment: cues for hand placement/technique for power up, assist needed to complete/steady but pt initiated rise from recliner and toilet.  Ambulation/Gait Ambulation/Gait assistance: Min assist Gait Distance (Feet): 100 Feet Assistive device: Rolling walker (2 wheeled) Gait Pattern/deviations: Step-to pattern;Decreased stride length;Decreased weight shift to right Gait velocity: decr   General Gait Details: cues for step pattern  and proximity to RW as pt had tendency to advacne walker too far forward during gait. no overt LOB noted or buckling at Rt knee. pt ambulated in hall and then to bathroom in room.   Stairs             Wheelchair Mobility    Modified Rankin (Stroke Patients Only)       Balance Overall balance assessment: Needs assistance Sitting-balance support: Feet supported Sitting balance-Leahy Scale: Good     Standing balance support: During functional activity;Bilateral upper extremity supported Standing balance-Leahy Scale: Fair                              Cognition Arousal/Alertness: Awake/alert Behavior During Therapy: WFL for tasks assessed/performed Overall Cognitive Status: Within Functional Limits for tasks assessed                                        Exercises Total Joint Exercises Ankle Circles/Pumps: AROM;Both;15 reps;Seated Quad Sets: AROM;Right;10 reps;Seated Heel Slides: AROM;Right;10 reps;Seated Hip ABduction/ADduction: AROM;Right;10 reps;Seated    General Comments        Pertinent Vitals/Pain Pain Assessment: 0-10 Pain Score: 8  Pain Location: Rt knee Pain Descriptors / Indicators: Aching Pain Intervention(s): Limited activity within patient's tolerance;Monitored during session;Premedicated before session;Repositioned;Ice applied    Home Living                      Prior Function  PT Goals (current goals can now be found in the care plan section) Acute Rehab PT Goals Patient Stated Goal: stop hurting and get independent again PT Goal Formulation: With patient Time For Goal Achievement: 07/29/21 Potential to Achieve Goals: Good Progress towards PT goals: Progressing toward goals    Frequency    7X/week      PT Plan      Co-evaluation              AM-PAC PT "6 Clicks" Mobility   Outcome Measure  Help needed turning from your back to your side while in a flat bed without using  bedrails?: A Little Help needed moving from lying on your back to sitting on the side of a flat bed without using bedrails?: A Little Help needed moving to and from a bed to a chair (including a wheelchair)?: A Little Help needed standing up from a chair using your arms (e.g., wheelchair or bedside chair)?: A Little Help needed to walk in hospital room?: A Little Help needed climbing 3-5 steps with a railing? : A Lot 6 Click Score: 17    End of Session Equipment Utilized During Treatment: Gait belt Activity Tolerance: Patient tolerated treatment well;Patient limited by pain Patient left: in chair;with call bell/phone within reach;with chair alarm set;with family/visitor present Nurse Communication: Mobility status PT Visit Diagnosis: Muscle weakness (generalized) (M62.81);Difficulty in walking, not elsewhere classified (R26.2)     Time: [id number] PT Time Calculation (min) (ACUTE ONLY): 26 min  Charges:  $Gait Training: 8-22 mins $Therapeutic Exercise: 8-22 mins                     Verner Mould, DPT Acute Rehabilitation Services Office [phone] Pager [phone]    Jacques Navy 07/23/2021, 12:31 PM

## 2021-07-24 ENCOUNTER — Other Ambulatory Visit (HOSPITAL_COMMUNITY): Payer: Self-pay

## 2021-07-24 DIAGNOSIS — M1711 Unilateral primary osteoarthritis, right knee: Secondary | ICD-10-CM | POA: Diagnosis not present

## 2021-07-24 LAB — CBC
HCT: 40.4 % (ref 36.0–46.0)
Hemoglobin: 12.5 g/dL (ref 12.0–15.0)
MCH: 24.1 pg — ABNORMAL LOW (ref 26.0–34.0)
MCHC: 30.9 g/dL (ref 30.0–36.0)
MCV: 78 fL — ABNORMAL LOW (ref 80.0–100.0)
Platelets: 258 10*3/uL (ref 150–400)
RBC: 5.18 MIL/uL — ABNORMAL HIGH (ref 3.87–5.11)
RDW: 19.9 % — ABNORMAL HIGH (ref 11.5–15.5)
WBC: 12.1 10*3/uL — ABNORMAL HIGH (ref 4.0–10.5)
nRBC: 0 % (ref 0.0–0.2)

## 2021-07-24 LAB — GLUCOSE, CAPILLARY
Glucose-Capillary: 150 mg/dL — ABNORMAL HIGH (ref 70–99)
Glucose-Capillary: 140 mg/dL — ABNORMAL HIGH (ref 70–99)
Glucose-Capillary: 159 mg/dL — ABNORMAL HIGH (ref 70–99)
Glucose-Capillary: 130 mg/dL — ABNORMAL HIGH (ref 70–99)

## 2021-07-24 NOTE — Progress Notes (Signed)
Physical Therapy Treatment Patient Details Name: Margaret Cisneros MRN: [id number] DOB: 11/06/1951 Today's Date: 07/24/2021    History of Present Illness Patient is 70 y.o. female s/p Rt TKA on 07/22/21 with PMH significant for OA, asthma, DM, GERD, HTN, Lt TKA in 2020, Lt THA in 2019.    PT Comments    Patient more limited this AM and required min assist to bring Rt LE off EOB and to initiate power up to stand. Pt able to take several small steps but unable to amb greater distance due to pain. She will benefit from additional therapy session to progress mobility when pain is improved.     Follow Up Recommendations  Follow surgeon's recommendation for DC plan and follow-up therapies;Home health PT     Equipment Recommendations  3in1 (PT)    Recommendations for Other Services       Precautions / Restrictions Precautions Precautions: Fall Restrictions Weight Bearing Restrictions: No RLE Weight Bearing: Weight bearing as tolerated Other Position/Activity Restrictions: WBAT    Mobility  Bed Mobility Overal bed mobility: Needs Assistance Bed Mobility: Supine to Sit     Supine to sit: HOB elevated;Min assist     General bed mobility comments: cues to use belt to assist Rt LE off EOB and pt able to raise Rt LE onto bed without belt.    Transfers Overall transfer level: Needs assistance Equipment used: Rolling walker (2 wheeled) Transfers: Sit to/from Stand Sit to Stand: Min assist         General transfer comment: pt limited by pain today and min assist needed to rise from EOB. pt took small steps to move to recliner.  Ambulation/Gait Ambulation/Gait assistance: Min assist Gait Distance (Feet): 4 Feet Assistive device: Rolling walker (2 wheeled) Gait Pattern/deviations: Step-to pattern;Decreased stride length;Decreased weight shift to right Gait velocity: decr   General Gait Details: pt limited by pain and unable to tolerate ambulation for greater distance. pt took  small steps to recliner.   Stairs             Wheelchair Mobility    Modified Rankin (Stroke Patients Only)       Balance Overall balance assessment: Needs assistance Sitting-balance support: Feet supported Sitting balance-Leahy Scale: Good     Standing balance support: During functional activity;Bilateral upper extremity supported Standing balance-Leahy Scale: Fair                              Cognition Arousal/Alertness: Awake/alert Behavior During Therapy: WFL for tasks assessed/performed Overall Cognitive Status: Within Functional Limits for tasks assessed                                        Exercises      General Comments        Pertinent Vitals/Pain Pain Assessment: 0-10 Pain Score: 10-Worst pain ever Pain Location: Rt knee Pain Descriptors / Indicators: Aching Pain Intervention(s): Limited activity within patient's tolerance;Premedicated before session;Repositioned;Ice applied    Home Living                      Prior Function            PT Goals (current goals can now be found in the care plan section) Acute Rehab PT Goals Patient Stated Goal: stop hurting and get independent again PT Goal Formulation:  With patient Time For Goal Achievement: 07/29/21 Potential to Achieve Goals: Good Progress towards PT goals: Progressing toward goals    Frequency    7X/week      PT Plan Current plan remains appropriate    Co-evaluation              AM-PAC PT "6 Clicks" Mobility   Outcome Measure  Help needed turning from your back to your side while in a flat bed without using bedrails?: A Little Help needed moving from lying on your back to sitting on the side of a flat bed without using bedrails?: A Little Help needed moving to and from a bed to a chair (including a wheelchair)?: A Little Help needed standing up from a chair using your arms (e.g., wheelchair or bedside chair)?: A Little Help  needed to walk in hospital room?: A Little Help needed climbing 3-5 steps with a railing? : A Little 6 Click Score: 18    End of Session Equipment Utilized During Treatment: Gait belt Activity Tolerance: Patient tolerated treatment well;Patient limited by pain Patient left: in chair;with call bell/phone within reach;with chair alarm set;with family/visitor present Nurse Communication: Mobility status PT Visit Diagnosis: Muscle weakness (generalized) (M62.81);Difficulty in walking, not elsewhere classified (R26.2)     Time: [id number] PT Time Calculation (min) (ACUTE ONLY): 20 min  Charges:  $Therapeutic Activity: 8-22 mins                     Verner Mould, DPT Acute Rehabilitation Services Office [phone] Pager [phone]    Jacques Navy 07/24/2021, 5:49 PM

## 2021-07-24 NOTE — Progress Notes (Signed)
Physical Therapy Treatment Patient Details Name: Margaret Cisneros MRN: [id number] DOB: 02-03-1951 Today's Date: 07/24/2021    History of Present Illness Patient is 70 y.o. female s/p Rt TKA on 07/22/21 with PMH significant for OA, asthma, DM, GERD, HTN, Lt TKA in 2020, Lt THA in 2019.    PT Comments    Patient reported improvement in pain at start of session that continued to improve with mobility. Rt LE presents with expected POD2 edema and educated pt on benefits of ace wrap/compression to improve edema. Rt LE wrapped with figure-8 pattern from ankle to thigh. Pt was able to complete power up to stand with min guard only and ambulated ~75' this session with pt's friend providing guarding with supervision from therapist. Patient will benefit from additional therapy to improve independence with gait and stair training for safe discharge home.     Follow Up Recommendations  Follow surgeon's recommendation for DC plan and follow-up therapies;Home health PT     Equipment Recommendations  3in1 (PT)    Recommendations for Other Services       Precautions / Restrictions Precautions Precautions: Fall Restrictions Weight Bearing Restrictions: No RLE Weight Bearing: Weight bearing as tolerated Other Position/Activity Restrictions: WBAT    Mobility  Bed Mobility               General bed mobility comments: pt OOB in recliner at start and EOS.    Transfers Overall transfer level: Needs assistance Equipment used: Rolling walker (2 wheeled) Transfers: Sit to/from Stand Sit to Stand: Min guard         General transfer comment: pt with good recall for bil hand placement on armrest fo power up from recliner. no assist required this session and pt's pain improved.  Ambulation/Gait Ambulation/Gait assistance: Min guard Gait Distance (Feet): 75 Feet Assistive device: Rolling walker (2 wheeled) Gait Pattern/deviations: Step-to pattern;Decreased stride length;Decreased weight shift  to right Gait velocity: decr   General Gait Details: intermittent cues for step to pattern and proximity to RW. guarding for safety. pt's friend arrived during session and provided safe guarding druing gait with supervision from therapist.   Stairs             Wheelchair Mobility    Modified Rankin (Stroke Patients Only)       Balance Overall balance assessment: Needs assistance Sitting-balance support: Feet supported Sitting balance-Leahy Scale: Good     Standing balance support: During functional activity;Bilateral upper extremity supported Standing balance-Leahy Scale: Fair                              Cognition Arousal/Alertness: Awake/alert Behavior During Therapy: WFL for tasks assessed/performed Overall Cognitive Status: Within Functional Limits for tasks assessed                                        Exercises Total Joint Exercises Ankle Circles/Pumps: AROM;Both;15 reps;Seated Quad Sets: AROM;Right;10 reps;Seated Heel Slides: AAROM;Right;10 reps;Seated Hip ABduction/ADduction: AAROM;Right;10 reps;Seated    General Comments        Pertinent Vitals/Pain Pain Assessment: 0-10 Pain Score: 10-Worst pain ever Pain Location: Rt knee Pain Descriptors / Indicators: Aching Pain Intervention(s): Limited activity within patient's tolerance;Premedicated before session;Repositioned;Ice applied    Home Living  Prior Function            PT Goals (current goals can now be found in the care plan section) Acute Rehab PT Goals Patient Stated Goal: stop hurting and get independent again PT Goal Formulation: With patient Time For Goal Achievement: 07/29/21 Potential to Achieve Goals: Good Progress towards PT goals: Progressing toward goals    Frequency    7X/week      PT Plan Current plan remains appropriate    Co-evaluation              AM-PAC PT "6 Clicks" Mobility   Outcome  Measure  Help needed turning from your back to your side while in a flat bed without using bedrails?: A Little Help needed moving from lying on your back to sitting on the side of a flat bed without using bedrails?: A Little Help needed moving to and from a bed to a chair (including a wheelchair)?: A Little Help needed standing up from a chair using your arms (e.g., wheelchair or bedside chair)?: A Little Help needed to walk in hospital room?: A Little Help needed climbing 3-5 steps with a railing? : A Little 6 Click Score: 18    End of Session Equipment Utilized During Treatment: Gait belt Activity Tolerance: Patient tolerated treatment well;Patient limited by pain Patient left: in chair;with call bell/phone within reach;with chair alarm set;with family/visitor present Nurse Communication: Mobility status PT Visit Diagnosis: Muscle weakness (generalized) (M62.81);Difficulty in walking, not elsewhere classified (R26.2)     Time: [id number] PT Time Calculation (min) (ACUTE ONLY): 40 min  Charges:  $Gait Training: 8-22 mins $Therapeutic Exercise: 8-22 mins $Self Care/Home Management: 8-22                     Verner Mould, DPT Acute Rehabilitation Services Office [phone] Pager [phone]    Margaret Cisneros 07/24/2021, 6:02 PM

## 2021-07-24 NOTE — Progress Notes (Signed)
Subjective: 2 Days Post-Op Procedure(s) (LRB): TOTAL KNEE ARTHROPLASTY (Right) Patient reports pain as 5 on 0-10 scale.   Denies CP or SOB.  Voiding without difficulty. Positive flatus. Objective: Vital signs in last 24 hours: Temp:  [97.7 F (36.5 C)-98.2 F (36.8 C)] 98.2 F (36.8 C) (08/19 0615) Pulse Rate:  [79-100] 100 (08/19 0615) Resp:  [16-18] 17 (08/19 0615) BP: (120-148)/(78-99) 120/80 (08/19 0615) SpO2:  [94 %-99 %] 95 % (08/19 0615)  Intake/Output from previous day: 08/18 0701 - 08/19 0700 In: 1706.7 [P.O.:1420; I.V.:286.7] Out: 2000 [Urine:2000] Intake/Output this shift: No intake/output data recorded.  Recent Labs    07/23/21 0406 07/24/21 0323  HGB 12.5 12.5   Recent Labs    07/23/21 0406 07/24/21 0323  WBC 14.3* 12.1*  RBC 5.18* 5.18*  HCT 40.8 40.4  PLT 268 258   Recent Labs    07/23/21 0406  NA 138  K 4.1  CL 103  CO2 28  BUN 17  CREATININE 0.79  GLUCOSE 142*  CALCIUM 9.3   No results for input(s): LABPT, INR in the last 72 hours.  Neurologically intact Neurovascular intact Sensation intact distally Intact pulses distally Dorsiflexion/Plantar flexion intact Compartment soft Able dorsiflex and plantarflex the foot without pain.  She is point tenderness on the medial aspect of the knee.  Thigh is soft.  Assessment/Plan:  2 Days Post-Op Procedure(s) (LRB): TOTAL KNEE ARTHROPLASTY (Right) Up with therapy This point I would recommend additional PT.  She is having more pain than she did yesterday.  Her TED stockings were off.  There is some increase in and swelling.  The compartments are soft and she is neurovascularly intact.  I spoke with therapy she was able to ambulate.  Having more pain today than yesterday.  We discussed wrapping the leg elevating it and icing it.  And to see if that makes a difference.  Therapy will contact me later this afternoon after her appointment to determine discharge status.  Active Problems:   Right  knee DJD      Johnn Hai 07/24/2021, '@NOW'$ 

## 2021-07-24 NOTE — Plan of Care (Signed)

## 2021-07-25 ENCOUNTER — Other Ambulatory Visit (HOSPITAL_COMMUNITY): Payer: Self-pay

## 2021-07-25 DIAGNOSIS — M1711 Unilateral primary osteoarthritis, right knee: Secondary | ICD-10-CM | POA: Diagnosis not present

## 2021-07-25 LAB — CBC
HCT: 39.6 % (ref 36.0–46.0)
Hemoglobin: 12.2 g/dL (ref 12.0–15.0)
MCH: 23.9 pg — ABNORMAL LOW (ref 26.0–34.0)
MCHC: 30.8 g/dL (ref 30.0–36.0)
MCV: 77.6 fL — ABNORMAL LOW (ref 80.0–100.0)
Platelets: 268 10*3/uL (ref 150–400)
RBC: 5.1 MIL/uL (ref 3.87–5.11)
RDW: 19.3 % — ABNORMAL HIGH (ref 11.5–15.5)
WBC: 13.1 10*3/uL — ABNORMAL HIGH (ref 4.0–10.5)
nRBC: 0 % (ref 0.0–0.2)

## 2021-07-25 LAB — GLUCOSE, CAPILLARY: Glucose-Capillary: 138 mg/dL — ABNORMAL HIGH (ref 70–99)

## 2021-07-25 MED ORDER — OXYCODONE HCL 10 MG PO TABS
10.0000 mg | ORAL_TABLET | ORAL | 0 refills | Status: AC | PRN
  Filled 2021-07-25: qty 40, 6d supply, fill #0

## 2021-07-25 NOTE — Progress Notes (Signed)
Subjective: 3 Days Post-Op Procedure(s) (LRB): TOTAL KNEE ARTHROPLASTY (Right) Patient reports pain as 4 on 0-10 scale.   Denies CP or SOB.  Voiding without difficulty. Positive flatus. Feels better with leg wrapped Objective: Vital signs in last 24 hours: Temp:  [98.2 F (36.8 C)-99.5 F (37.5 C)] 99.5 F (37.5 C) (08/20 0601) Pulse Rate:  [100-110] 103 (08/20 0601) Resp:  [17-18] 18 (08/20 0601) BP: (117-134)/(64-84) 117/64 (08/20 0601) SpO2:  [91 %-98 %] 98 % (08/20 0601)  Intake/Output from previous day: 08/19 0701 - 08/20 0700 In: 1320 [P.O.:1320] Out: 2600 [Urine:2600] Intake/Output this shift: Total I/O In: -  Out: 650 [Urine:650]  Recent Labs    07/23/21 0406 07/24/21 0323 07/25/21 0321  HGB 12.5 12.5 12.2   Recent Labs    07/24/21 0323 07/25/21 0321  WBC 12.1* 13.1*  RBC 5.18* 5.10  HCT 40.4 39.6  PLT 258 268   Recent Labs    07/23/21 0406  NA 138  K 4.1  CL 103  CO2 28  BUN 17  CREATININE 0.79  GLUCOSE 142*  CALCIUM 9.3   No results for input(s): LABPT, INR in the last 72 hours.  ABD soft Neurovascular intact Sensation intact distally Dorsiflexion/Plantar flexion intact Compartment soft No pain with passive DF/PF TEDS off Assessment/Plan:  3 Days Post-Op Procedure(s) (LRB): TOTAL KNEE ARTHROPLASTY (Right) Up with therapy Needs BM. Miralax and dulcolax Reduce pain med to !'0mg'$  Plan d/c later today if tolerates PT well D/C instr given   Active Problems:   Right knee DJD      Johnn Hai 07/25/2021, '@NOW'$ 

## 2021-07-25 NOTE — Plan of Care (Signed)
Patient dc'd, all care plans complete

## 2021-07-25 NOTE — Progress Notes (Signed)
Physical Therapy Treatment Patient Details Name: Margaret Cisneros MRN: [id number] DOB: [birth year] Today's Date: 07/25/2021    History of Present Illness Patient is 70 y.o. female s/p Rt TKA on 07/22/21 with PMH significant for OA, asthma, DM, GERD, HTN, Lt TKA in 2020, Lt THA in 2019.    PT Comments    Pt in good spirits and progressing well with mobility.  Pt performed HEP and up to ambulate increased distance in hall and with reports of decreased pain.  Pt eager for dc home this date.  Follow Up Recommendations  Follow surgeon's recommendation for DC plan and follow-up therapies;Home health PT     Equipment Recommendations  3in1 (PT)    Recommendations for Other Services       Precautions / Restrictions Precautions Precautions: Fall Restrictions Weight Bearing Restrictions: No RLE Weight Bearing: Weight bearing as tolerated    Mobility  Bed Mobility Overal bed mobility: Needs Assistance Bed Mobility: Supine to Sit     Supine to sit: Supervision     General bed mobility comments: use of rails but no physical assist    Transfers Overall transfer level: Needs assistance Equipment used: Rolling walker (2 wheeled) Transfers: Sit to/from Stand Sit to Stand: Min guard         General transfer comment: pt self cues for LE management and use of UEs to self assist  Ambulation/Gait Ambulation/Gait assistance: Min guard;Supervision Gait Distance (Feet): 120 Feet Assistive device: Rolling walker (2 wheeled) Gait Pattern/deviations: Step-to pattern;Decreased stride length;Decreased weight shift to right;Step-through pattern;Trunk flexed;Shuffle     General Gait Details: cues for posture, postion from RW and sequence   Stairs Stairs:  (Pt declined stairs stating she would use level back entrance)           Wheelchair Mobility    Modified Rankin (Stroke Patients Only)       Balance Overall balance assessment: Needs assistance Sitting-balance support: Feet  supported Sitting balance-Leahy Scale: Good     Standing balance support: During functional activity;Bilateral upper extremity supported Standing balance-Leahy Scale: Fair                              Cognition Arousal/Alertness: Awake/alert Behavior During Therapy: WFL for tasks assessed/performed Overall Cognitive Status: Within Functional Limits for tasks assessed                                        Exercises Total Joint Exercises Ankle Circles/Pumps: AROM;Both;15 reps;Seated Quad Sets: AROM;Right;10 reps;Seated Heel Slides: AAROM;Right;Seated;20 reps Hip ABduction/ADduction: AAROM;Right;10 reps;Seated Straight Leg Raises: AAROM;Right;15 reps;Supine    General Comments        Pertinent Vitals/Pain Pain Assessment: 0-10 Pain Score: 6  Pain Location: Rt knee Pain Descriptors / Indicators: Aching Pain Intervention(s): Limited activity within patient's tolerance;Monitored during session;Premedicated before session;Ice applied    Home Living                      Prior Function            PT Goals (current goals can now be found in the care plan section) Acute Rehab PT Goals Patient Stated Goal: stop hurting and get independent again PT Goal Formulation: With patient Time For Goal Achievement: 07/29/21 Potential to Achieve Goals: Good Progress towards PT goals: Progressing toward goals    Frequency  7X/week      PT Plan Current plan remains appropriate    Co-evaluation              AM-PAC PT "6 Clicks" Mobility   Outcome Measure  Help needed turning from your back to your side while in a flat bed without using bedrails?: A Little Help needed moving from lying on your back to sitting on the side of a flat bed without using bedrails?: A Little Help needed moving to and from a bed to a chair (including a wheelchair)?: A Little Help needed standing up from a chair using your arms (e.g., wheelchair or bedside  chair)?: A Little Help needed to walk in hospital room?: A Little Help needed climbing 3-5 steps with a railing? : A Little 6 Click Score: 18    End of Session Equipment Utilized During Treatment: Gait belt Activity Tolerance: Patient tolerated treatment well;Patient limited by pain Patient left: in chair;with call bell/phone within reach;with chair alarm set;with family/visitor present Nurse Communication: Mobility status PT Visit Diagnosis: Muscle weakness (generalized) (M62.81);Difficulty in walking, not elsewhere classified (R26.2)     Time: [id number] PT Time Calculation (min) (ACUTE ONLY): 36 min  Charges:  $Gait Training: 8-22 mins $Therapeutic Exercise: 8-22 mins                     [place] Pager [phone] Office [phone]    Delaila Nand 07/25/2021, 11:48 AM

## 2021-07-27 ENCOUNTER — Other Ambulatory Visit (HOSPITAL_COMMUNITY): Payer: Self-pay

## 2021-07-31 ENCOUNTER — Ambulatory Visit: Payer: Medicare HMO

## 2021-07-31 NOTE — Discharge Summary (Signed)
Physician Discharge Summary   Patient ID: Margaret Cisneros MRN: [id number] DOB/AGE: 05-13-1951 70 y.o.  Admit date: 07/22/2021 Discharge date: 07/25/2021  Primary Diagnosis: right knee primary osteoarthritis  Admission Diagnoses:  Past Medical History:  Diagnosis Date   Arthritis    right knee arthritis uses celbrex to treat    Asthma    Diabetes mellitus without complication ([place])    Family history of adverse reaction to anesthesia    sister- nausea and vomiting after surgery   GERD (gastroesophageal reflux disease)    severe    Hypertension    Discharge Diagnoses:   Active Problems:   Right knee DJD  Estimated body mass index is 37.49 kg/m as calculated from the following:   Height as of this encounter: 5' 7.5" (1.715 m).   Weight as of this encounter: 110.2 kg.  Procedure:  Procedure(s) (LRB): TOTAL KNEE ARTHROPLASTY (Right)   Consults: None  HPI: see H&P Laboratory Data: Admission on 07/22/2021, Discharged on 07/25/2021  Component Date Value Ref Range Status   Glucose-Capillary 07/22/2021 189 (A) 70 - 99 mg/dL Final   Glucose reference range applies only to samples taken after fasting for at least 8 hours.   Glucose-Capillary 07/22/2021 181 (A) 70 - 99 mg/dL Final   Glucose reference range applies only to samples taken after fasting for at least 8 hours.   Comment 1 07/22/2021 Notify RN   Final   Comment 2 07/22/2021 Document in Chart   Final   WBC 07/23/2021 14.3 (A) 4.0 - 10.5 K/uL Final   RBC 07/23/2021 5.18 (A) 3.87 - 5.11 MIL/uL Final   Hemoglobin 07/23/2021 12.5  12.0 - 15.0 g/dL Final   HCT 07/23/2021 40.8  36.0 - 46.0 % Final   MCV 07/23/2021 78.8 (A) 80.0 - 100.0 fL Final   MCH 07/23/2021 24.1 (A) 26.0 - 34.0 pg Final   MCHC 07/23/2021 30.6  30.0 - 36.0 g/dL Final   RDW 07/23/2021 19.9 (A) 11.5 - 15.5 % Final   Platelets 07/23/2021 268  150 - 400 K/uL Final   nRBC 07/23/2021 0.0  0.0 - 0.2 % Final   Performed at [facility], [place]  [address], [place], [place] [address]   Sodium 07/23/2021 138  135 - 145 mmol/L Final   Potassium 07/23/2021 4.1  3.5 - 5.1 mmol/L Final   Chloride 07/23/2021 103  98 - 111 mmol/L Final   CO2 07/23/2021 28  22 - 32 mmol/L Final   Glucose, Bld 07/23/2021 142 (A) 70 - 99 mg/dL Final   Glucose reference range applies only to samples taken after fasting for at least 8 hours.   BUN 07/23/2021 17  8 - 23 mg/dL Final   Creatinine, Ser 07/23/2021 0.79  0.44 - 1.00 mg/dL Final   Calcium 07/23/2021 9.3  8.9 - 10.3 mg/dL Final   GFR, Estimated 07/23/2021 >60  >60 mL/min Final   Comment: (NOTE) Calculated using the CKD-EPI Creatinine Equation (2021)    Anion gap 07/23/2021 7  5 - 15 Final   Performed at [facility], [place] [address] [place], [place] [address]   Glucose-Capillary 07/22/2021 161 (A) 70 - 99 mg/dL Final   Glucose reference range applies only to samples taken after fasting for at least 8 hours.   Glucose-Capillary 07/22/2021 182 (A) 70 - 99 mg/dL Final   Glucose reference range applies only to samples taken after fasting for at least 8 hours.   Glucose-Capillary 07/23/2021 176 (A) 70 - 99  mg/dL Final   Glucose reference range applies only to samples taken after fasting for at least 8 hours.   Glucose-Capillary 07/23/2021 135 (A) 70 - 99 mg/dL Final   Glucose reference range applies only to samples taken after fasting for at least 8 hours.   Glucose-Capillary 07/23/2021 157 (A) 70 - 99 mg/dL Final   Glucose reference range applies only to samples taken after fasting for at least 8 hours.   WBC 07/24/2021 12.1 (A) 4.0 - 10.5 K/uL Final   RBC 07/24/2021 5.18 (A) 3.87 - 5.11 MIL/uL Final   Hemoglobin 07/24/2021 12.5  12.0 - 15.0 g/dL Final   HCT 07/24/2021 40.4  36.0 - 46.0 % Final   MCV 07/24/2021 78.0 (A) 80.0 - 100.0 fL Final   MCH 07/24/2021 24.1 (A) 26.0 - 34.0 pg Final   MCHC 07/24/2021 30.9  30.0 - 36.0 g/dL Final   RDW 07/24/2021 19.9 (A) 11.5 - 15.5 % Final    Platelets 07/24/2021 258  150 - 400 K/uL Final   nRBC 07/24/2021 0.0  0.0 - 0.2 % Final   Performed at [facility], [place] [address], [place], [place] [address]   Glucose-Capillary 07/23/2021 122 (A) 70 - 99 mg/dL Final   Glucose reference range applies only to samples taken after fasting for at least 8 hours.   Glucose-Capillary 07/24/2021 150 (A) 70 - 99 mg/dL Final   Glucose reference range applies only to samples taken after fasting for at least 8 hours.   Glucose-Capillary 07/24/2021 140 (A) 70 - 99 mg/dL Final   Glucose reference range applies only to samples taken after fasting for at least 8 hours.   Glucose-Capillary 07/24/2021 159 (A) 70 - 99 mg/dL Final   Glucose reference range applies only to samples taken after fasting for at least 8 hours.   WBC 07/25/2021 13.1 (A) 4.0 - 10.5 K/uL Final   RBC 07/25/2021 5.10  3.87 - 5.11 MIL/uL Final   Hemoglobin 07/25/2021 12.2  12.0 - 15.0 g/dL Final   HCT 07/25/2021 39.6  36.0 - 46.0 % Final   MCV 07/25/2021 77.6 (A) 80.0 - 100.0 fL Final   MCH 07/25/2021 23.9 (A) 26.0 - 34.0 pg Final   MCHC 07/25/2021 30.8  30.0 - 36.0 g/dL Final   RDW 07/25/2021 19.3 (A) 11.5 - 15.5 % Final   Platelets 07/25/2021 268  150 - 400 K/uL Final   nRBC 07/25/2021 0.0  0.0 - 0.2 % Final   Performed at [facility], [place] [address] [place], [place] [address]   Glucose-Capillary 07/24/2021 130 (A) 70 - 99 mg/dL Final   Glucose reference range applies only to samples taken after fasting for at least 8 hours.   Comment 1 07/24/2021 Notify RN   Final   Comment 2 07/24/2021 Document in Chart   Final   Glucose-Capillary 07/25/2021 138 (A) 70 - 99 mg/dL Final   Glucose reference range applies only to samples taken after fasting for at least 8 hours.  Orders Only on 07/20/2021  Component Date Value Ref Range Status   SARS Coronavirus 2 07/20/2021 RESULT: NEGATIVE   Final   Comment: RESULT: NEGATIVESARS-CoV-2 INTERPRETATION:A  NEGATIVE  test result means that SARS-CoV-2 RNA was not present in the specimen above the limit of detection of this test. This does not preclude a possible SARS-CoV-2 infection and should not be used as the  sole basis for patient management decisions. Negative results must be combined with clinical observations, patient history, and  epidemiological information. Optimum specimen types and timing for peak viral levels during infections caused by SARS-CoV-2  have not been determined. Collection of multiple specimens or types of specimens may be necessary to detect virus. Improper specimen collection and handling, sequence variability under primers/probes, or organism present below the limit of detection may  lead to false negative results. Positive and negative predictive values of testing are highly dependent on prevalence. False negative test results are more likely when prevalence of disease is high.The expected result is NEGATIVE.Fact S                          heet for  Healthcare Providers: LocalChronicle.no Sheet for Patients: SalonLookup.es Reference Range - Negative   Hospital Outpatient Visit on 07/09/2021  Component Date Value Ref Range Status   Glucose-Capillary 07/09/2021 142 (A) 70 - 99 mg/dL Final   Glucose reference range applies only to samples taken after fasting for at least 8 hours.   MRSA, PCR 07/09/2021 NEGATIVE  NEGATIVE Final   Staphylococcus aureus 07/09/2021 NEGATIVE  NEGATIVE Final   Comment: (NOTE) The Xpert SA Assay (FDA approved for NASAL specimens in patients 77 years of age and older), is one component of a comprehensive surveillance program. It is not intended to diagnose infection nor to guide or monitor treatment. Performed at [facility], [place] [address], [place], [place] [address]    WBC 07/09/2021 8.5  4.0 - 10.5 K/uL Final   RBC 07/09/2021 5.46 (A) 3.87 - 5.11 MIL/uL Final    Hemoglobin 07/09/2021 13.1  12.0 - 15.0 g/dL Final   HCT 07/09/2021 42.9  36.0 - 46.0 % Final   MCV 07/09/2021 78.6 (A) 80.0 - 100.0 fL Final   MCH 07/09/2021 24.0 (A) 26.0 - 34.0 pg Final   MCHC 07/09/2021 30.5  30.0 - 36.0 g/dL Final   RDW 07/09/2021 20.7 (A) 11.5 - 15.5 % Final   Platelets 07/09/2021 268  150 - 400 K/uL Final   nRBC 07/09/2021 0.0  0.0 - 0.2 % Final   Performed at [facility], [place] [address] [place], [place] [address]   Sodium 07/09/2021 137  135 - 145 mmol/L Final   Potassium 07/09/2021 3.6  3.5 - 5.1 mmol/L Final   Chloride 07/09/2021 103  98 - 111 mmol/L Final   CO2 07/09/2021 27  22 - 32 mmol/L Final   Glucose, Bld 07/09/2021 125 (A) 70 - 99 mg/dL Final   Glucose reference range applies only to samples taken after fasting for at least 8 hours.   BUN 07/09/2021 19  8 - 23 mg/dL Final   Creatinine, Ser 07/09/2021 0.90  0.44 - 1.00 mg/dL Final   Calcium 07/09/2021 9.1  8.9 - 10.3 mg/dL Final   GFR, Estimated 07/09/2021 >60  >60 mL/min Final   Comment: (NOTE) Calculated using the CKD-EPI Creatinine Equation (2021)    Anion gap 07/09/2021 7  5 - 15 Final   Performed at [facility], [place] [address], [place], [place] [address]   Hgb A1c MFr Bld 07/09/2021 6.9 (A) 4.8 - 5.6 % Final   Comment: (NOTE) Pre diabetes:          5.7%-6.4%  Diabetes:              >6.4%  Glycemic control for   <7.0% adults with diabetes    Mean Plasma Glucose 07/09/2021 151.33  mg/dL Final   Performed at [facility],  [address] [place], [place] [address]     X-Rays:DG Knee 1-2 Views Right  Result Date: 07/22/2021 CLINICAL DATA:  Postop knee replacement EXAM: RIGHT KNEE - 1-2 VIEW COMPARISON:  Contralateral knee radiographs 02/13/2021 FINDINGS: The patient is status post right total knee arthroplasty and patellar resurfacing. Hardware alignment is within expected limits, without evidence of complication. There is significant surrounding  soft tissue swelling and soft tissue gas consistent with recent operation. IMPRESSION: Status post right knee arthroplasty without evidence of complication. Electronically Signed   By: Valetta Mole M.D.   On: 07/22/2021 13:20    EKG: Orders placed or performed during the hospital encounter of 07/09/21   EKG 12 lead per protocol   EKG 12-Lead   EKG 12-Lead   EKG 12 lead per protocol     Hospital Course: Margaret Cisneros is a 70 y.o. who was admitted to [facility]. They were brought to the operating room on 07/22/2021 and underwent Procedure(s): TOTAL KNEE ARTHROPLASTY.  Patient tolerated the procedure well and was later transferred to the recovery room and then to the orthopaedic floor for postoperative care.  They were given PO and IV analgesics for pain control following their surgery.  They were given 24 hours of postoperative antibiotics of  Anti-infectives (From admission, onward)    Start     Dose/Rate Route Frequency Ordered Stop   07/22/21 1500  ceFAZolin (ANCEF) IVPB 2g/100 mL premix        2 g 200 mL/hr over 30 Minutes Intravenous Every 6 hours 07/22/21 1406 07/22/21 2102   07/22/21 0615  ceFAZolin (ANCEF) IVPB 2g/100 mL premix        2 g 200 mL/hr over 30 Minutes Intravenous On call to O.R. 07/22/21 [id number] 07/22/21 0845      and started on DVT prophylaxis in the form of Aspirin, TED hose, and SCDs .   PT and OT were ordered for total joint protocol.  Discharge planning consulted to help with postop disposition and equipment needs.  Patient had a good night on the evening of surgery.  They started to get up OOB with therapy on day one. Continued to work with therapy into day two.  By day three, the patient had progressed with therapy and meeting their goals.  Incision was healing well.  Patient was seen in rounds and was ready to go home.   Diet: Regular diet Activity:WBAT Follow-up:in 2 weeks Disposition - Home Discharged Condition: good   Discharge Instructions      Call MD / Call 911   Complete by: As directed    If you experience chest pain or shortness of breath, CALL 911 and be transported to the hospital emergency room.  If you develope a fever above 101 F, pus (white drainage) or increased drainage or redness at the wound, or calf pain, call your surgeon's office.   Constipation Prevention   Complete by: As directed    Drink plenty of fluids.  Prune juice may be helpful.  You may use a stool softener, such as Colace (over the counter) 100 mg twice a day.  Use MiraLax (over the counter) for constipation as needed.   Diet - low sodium heart healthy   Complete by: As directed    Increase activity slowly as tolerated   Complete by: As directed    Post-operative opioid taper instructions:   Complete by: As directed    POST-OPERATIVE OPIOID TAPER INSTRUCTIONS: It is important to wean off of your  opioid medication as soon as possible. If you do not need pain medication after your surgery it is ok to stop day one. Opioids include: Codeine, Hydrocodone(Norco, Vicodin), Oxycodone(Percocet, oxycontin) and hydromorphone amongst others.  Long term and even short term use of opiods can cause: Increased pain response Dependence Constipation Depression Respiratory depression And more.  Withdrawal symptoms can include Flu like symptoms Nausea, vomiting And more Techniques to manage these symptoms Hydrate well Eat regular healthy meals Stay active Use relaxation techniques(deep breathing, meditating, yoga) Do Not substitute Alcohol to help with tapering If you have been on opioids for less than two weeks and do not have pain than it is ok to stop all together.  Plan to wean off of opioids This plan should start within one week post op of your joint replacement. Maintain the same interval or time between taking each dose and first decrease the dose.  Cut the total daily intake of opioids by one tablet each day Next start to increase the time between  doses. The last dose that should be eliminated is the evening dose.         Allergies as of 07/25/2021   No Active Allergies      Medication List     TAKE these medications    albuterol 108 (90 Base) MCG/ACT inhaler Commonly known as: VENTOLIN HFA Inhale 2 puffs into the lungs every 6 (six) hours as needed for wheezing or shortness of breath.   aspirin EC 81 MG tablet Take 1 tablet (81 mg total) by mouth 2 (two) times daily after a meal. Day after surgery   BLINK TEARS OP Place 1 drop into both eyes daily as needed (dry eyes).   celecoxib 200 MG capsule Commonly known as: CELEBREX Take 200 mg by mouth daily.   docusate sodium 100 MG capsule Commonly known as: Colace Take 1 capsule (100 mg total) by mouth 2 (two) times daily as needed for mild constipation.   empagliflozin 25 MG Tabs tablet Commonly known as: JARDIANCE Take 25 mg by mouth daily.   fluticasone 50 MCG/ACT nasal spray Commonly known as: FLONASE Place 2 sprays into both nostrils daily.   gabapentin 100 MG capsule Commonly known as: NEURONTIN Take 100 mg by mouth 3 (three) times daily.   hydrochlorothiazide 25 MG tablet Commonly known as: HYDRODIURIL Take 25 mg by mouth daily.   loratadine 10 MG tablet Commonly known as: CLARITIN Take 10 mg by mouth daily.   losartan 100 MG tablet Commonly known as: COZAAR Take 100 mg by mouth daily.   metFORMIN 500 MG tablet Commonly known as: GLUCOPHAGE Take 500 mg by mouth 2 (two) times daily.   Norel AD 4-10-325 MG Tabs Generic drug: Chlorphen-PE-Acetaminophen Take 1 tablet by mouth 2 (two) times daily as needed (allergies).   omeprazole 40 MG capsule Commonly known as: PRILOSEC Take 40 mg by mouth daily.   Oxycodone HCl 10 MG Tabs Take 1 tablet (10 mg total) by mouth every 4 (four) hours as needed for up to 7 days.   polyethylene glycol 17 g packet Commonly known as: MIRALAX / GLYCOLAX Mix 1 packet with liquid and take by mouth daily as  directed   triamcinolone cream 0.5 % Commonly known as: KENALOG Apply 1 application topically 2 (two) times daily as needed for rash.   Trulicity [id number] Sopn Generic drug: Dulaglutide Inject 0.75 mg into the skin every Tuesday.   Vitamin D2 50 MCG (2000 UT) Tabs Take 2,000 Units by mouth daily.  Signed: Lacie Draft, PA-C Orthopaedic Surgery 07/31/2021, 12:47 PM

## 2021-08-05 ENCOUNTER — Other Ambulatory Visit (HOSPITAL_COMMUNITY): Payer: Self-pay

## 2021-08-07 ENCOUNTER — Other Ambulatory Visit (HOSPITAL_COMMUNITY): Payer: Self-pay

## 2021-08-11 ENCOUNTER — Other Ambulatory Visit (HOSPITAL_COMMUNITY): Payer: Self-pay

## 2021-08-18 ENCOUNTER — Other Ambulatory Visit (HOSPITAL_COMMUNITY): Payer: Self-pay

## 2021-09-25 ENCOUNTER — Ambulatory Visit: Payer: Medicare HMO

## 2021-10-23 ENCOUNTER — Ambulatory Visit: Payer: Medicare HMO

## 2021-10-28 ENCOUNTER — Ambulatory Visit: Payer: Medicare HMO

## 2021-11-24 ENCOUNTER — Ambulatory Visit: Payer: Medicare HMO

## 2021-12-23 ENCOUNTER — Ambulatory Visit
Admission: RE | Admit: 2021-12-23 | Discharge: 2021-12-23 | Disposition: A | Discharge status: Home / Self Care | Payer: Medicare HMO | Source: Ambulatory Visit | Attending: Internal Medicine | Admitting: Internal Medicine

## 2021-12-23 DIAGNOSIS — Z1231 Encounter for screening mammogram for malignant neoplasm of breast: Secondary | ICD-10-CM

## 2022-04-15 ENCOUNTER — Other Ambulatory Visit: Payer: Self-pay | Admitting: Internal Medicine

## 2022-04-16 LAB — COMPLETE METABOLIC PANEL WITH GFR
AG Ratio: 1 (calc) (ref 1.0–2.5)
ALT: 14 U/L (ref 6–29)
AST: 16 U/L (ref 10–35)
Albumin: 3.8 g/dL (ref 3.6–5.1)
Alkaline phosphatase (APISO): 80 U/L (ref 37–153)
BUN: 19 mg/dL (ref 7–25)
CO2: 24 mmol/L (ref 20–32)
Calcium: 9.3 mg/dL (ref 8.6–10.4)
Chloride: 98 mmol/L (ref 98–110)
Creat: 0.89 mg/dL (ref 0.50–1.05)
Globulin: 3.7 g/dL (calc) (ref 1.9–3.7)
Glucose, Bld: 145 mg/dL — ABNORMAL HIGH (ref 65–99)
Potassium: 4.4 mmol/L (ref 3.5–5.3)
Sodium: 137 mmol/L (ref 135–146)
Total Bilirubin: 0.5 mg/dL (ref 0.2–1.2)
Total Protein: 7.5 g/dL (ref 6.1–8.1)
eGFR: 70 mL/min/{1.73_m2} (ref >=60)

## 2022-04-16 LAB — CBC
HCT: 47.3 % — ABNORMAL HIGH (ref 35.0–45.0)
Hemoglobin: 14.9 g/dL (ref 11.7–15.5)
MCH: 25.9 pg — ABNORMAL LOW (ref 27.0–33.0)
MCHC: 31.5 g/dL — ABNORMAL LOW (ref 32.0–36.0)
MCV: 82.1 fL (ref 80.0–100.0)
MPV: 11.5 fL (ref 7.5–12.5)
Platelets: 268 10*3/uL (ref 140–400)
RBC: 5.76 10*6/uL — ABNORMAL HIGH (ref 3.80–5.10)
RDW: 16.8 % — ABNORMAL HIGH (ref 11.0–15.0)
WBC: 8.9 10*3/uL (ref 3.8–10.8)

## 2022-04-16 LAB — LIPID PANEL
Cholesterol: 174 mg/dL (ref <200)
HDL: 55 mg/dL (ref >=50)
LDL Cholesterol (Calc): 99 mg/dL (calc)
Non-HDL Cholesterol (Calc): 119 mg/dL (calc) (ref <130)
Total CHOL/HDL Ratio: 3.2 (calc) (ref <5.0)
Triglycerides: 106 mg/dL (ref <150)

## 2022-04-16 LAB — VITAMIN D 25 HYDROXY (VIT D DEFICIENCY, FRACTURES): Vit D, 25-Hydroxy: 35 ng/mL (ref 30–100)

## 2022-04-16 LAB — FOLATE: Folate: 9.4 ng/mL

## 2022-04-16 LAB — TSH: TSH: 1.13 mIU/L (ref 0.40–4.50)

## 2022-04-16 LAB — VITAMIN B12: Vitamin B-12: 375 pg/mL (ref 200–1100)

## 2022-07-20 ENCOUNTER — Other Ambulatory Visit: Payer: Self-pay | Admitting: Internal Medicine

## 2022-07-20 DIAGNOSIS — E559 Vitamin D deficiency, unspecified: Secondary | ICD-10-CM | POA: Diagnosis not present

## 2022-07-20 DIAGNOSIS — N950 Postmenopausal bleeding: Secondary | ICD-10-CM | POA: Diagnosis not present

## 2022-07-20 DIAGNOSIS — E1142 Type 2 diabetes mellitus with diabetic polyneuropathy: Secondary | ICD-10-CM | POA: Diagnosis not present

## 2022-07-20 DIAGNOSIS — I10 Essential (primary) hypertension: Secondary | ICD-10-CM | POA: Diagnosis not present

## 2022-07-28 LAB — TEST AUTHORIZATION

## 2022-07-28 LAB — URINE CULTURE
MICRO NUMBER:: 13781745
SPECIMEN QUALITY:: ADEQUATE

## 2022-07-28 LAB — TIQ-NTM

## 2022-07-28 LAB — VITAMIN D 25 HYDROXY (VIT D DEFICIENCY, FRACTURES)

## 2022-07-28 LAB — C. TRACHOMATIS/N. GONORRHOEAE RNA
C. trachomatis RNA, TMA: NOT DETECTED
N. gonorrhoeae RNA, TMA: NOT DETECTED

## 2022-08-03 DIAGNOSIS — N950 Postmenopausal bleeding: Secondary | ICD-10-CM | POA: Diagnosis not present

## 2022-08-03 DIAGNOSIS — E1142 Type 2 diabetes mellitus with diabetic polyneuropathy: Secondary | ICD-10-CM | POA: Diagnosis not present

## 2022-08-03 DIAGNOSIS — I10 Essential (primary) hypertension: Secondary | ICD-10-CM | POA: Diagnosis not present

## 2022-08-26 DIAGNOSIS — Z113 Encounter for screening for infections with a predominantly sexual mode of transmission: Secondary | ICD-10-CM | POA: Diagnosis not present

## 2022-08-26 DIAGNOSIS — J45909 Unspecified asthma, uncomplicated: Secondary | ICD-10-CM | POA: Diagnosis not present

## 2022-08-26 DIAGNOSIS — E119 Type 2 diabetes mellitus without complications: Secondary | ICD-10-CM | POA: Diagnosis not present

## 2022-08-26 DIAGNOSIS — E669 Obesity, unspecified: Secondary | ICD-10-CM | POA: Diagnosis not present

## 2022-08-26 DIAGNOSIS — Z124 Encounter for screening for malignant neoplasm of cervix: Secondary | ICD-10-CM | POA: Diagnosis not present

## 2022-08-26 DIAGNOSIS — R87610 Atypical squamous cells of undetermined significance on cytologic smear of cervix (ASC-US): Secondary | ICD-10-CM | POA: Diagnosis not present

## 2022-08-26 DIAGNOSIS — N950 Postmenopausal bleeding: Secondary | ICD-10-CM | POA: Diagnosis not present

## 2022-08-26 DIAGNOSIS — I10 Essential (primary) hypertension: Secondary | ICD-10-CM | POA: Diagnosis not present

## 2022-09-15 ENCOUNTER — Other Ambulatory Visit: Payer: Self-pay

## 2022-09-15 DIAGNOSIS — N858 Other specified noninflammatory disorders of uterus: Secondary | ICD-10-CM | POA: Diagnosis not present

## 2022-09-20 DIAGNOSIS — N950 Postmenopausal bleeding: Secondary | ICD-10-CM | POA: Diagnosis not present

## 2022-09-20 DIAGNOSIS — Z6836 Body mass index (BMI) 36.0-36.9, adult: Secondary | ICD-10-CM | POA: Diagnosis not present

## 2022-09-20 DIAGNOSIS — B3731 Acute candidiasis of vulva and vagina: Secondary | ICD-10-CM | POA: Diagnosis not present

## 2022-09-20 DIAGNOSIS — R9389 Abnormal findings on diagnostic imaging of other specified body structures: Secondary | ICD-10-CM | POA: Diagnosis not present

## 2022-09-28 DIAGNOSIS — N950 Postmenopausal bleeding: Secondary | ICD-10-CM | POA: Diagnosis not present

## 2022-09-28 DIAGNOSIS — R9389 Abnormal findings on diagnostic imaging of other specified body structures: Secondary | ICD-10-CM | POA: Diagnosis not present

## 2022-09-28 DIAGNOSIS — R897 Abnormal histological findings in specimens from other organs, systems and tissues: Secondary | ICD-10-CM | POA: Diagnosis not present

## 2022-09-28 DIAGNOSIS — Z6836 Body mass index (BMI) 36.0-36.9, adult: Secondary | ICD-10-CM | POA: Diagnosis not present

## 2022-09-29 ENCOUNTER — Encounter: Payer: Medicaid Other | Admitting: Obstetrics and Gynecology

## 2022-09-30 ENCOUNTER — Other Ambulatory Visit: Payer: Self-pay | Admitting: Obstetrics and Gynecology

## 2022-10-01 DIAGNOSIS — M7061 Trochanteric bursitis, right hip: Secondary | ICD-10-CM | POA: Diagnosis not present

## 2022-10-01 DIAGNOSIS — Z96651 Presence of right artificial knee joint: Secondary | ICD-10-CM | POA: Diagnosis not present

## 2022-10-04 NOTE — Progress Notes (Signed)
Pre-op phone call attempted. Left message for patient to return phone call.

## 2022-10-06 ENCOUNTER — Encounter (HOSPITAL_BASED_OUTPATIENT_CLINIC_OR_DEPARTMENT_OTHER): Payer: Self-pay | Admitting: Obstetrics and Gynecology

## 2022-10-06 NOTE — Progress Notes (Signed)
Spoke w/ via phone for pre-op interview--- pt Lab needs dos----   [employer], ekg            Lab results------ no  COVID test -----patient states asymptomatic no test needed Arrive at ------- 0715 on 10-11-2022 NPO after MN NO Solid Food.  Clear liquids from MN until--- 0615 Med rec completed Medications to take morning of surgery ----- prilosec, gabapentin Diabetic medication ----- do not take metformin/ jardiance morning of surgery.  Does trulicity on Monday's last dose 10-04-2022 Patient instructed no nail polish to be worn day of surgery Patient instructed to bring photo id and insurance card day of surgery Patient aware to have Driver (ride ) / caregiver for 24 hours after surgery ---  sister, lorie Patient Special Instructions ----- n/a Pre-Op special Istructions ----- n/a Patient verbalized understanding of instructions that were given at this phone interview. Patient denies shortness of breath, chest pain, fever, cough at this phone interview.

## 2022-10-11 ENCOUNTER — Encounter (HOSPITAL_BASED_OUTPATIENT_CLINIC_OR_DEPARTMENT_OTHER)
Admission: RE | Disposition: A | Discharge status: Home / Self Care | Payer: Self-pay | Source: Ambulatory Visit | Attending: Obstetrics and Gynecology

## 2022-10-11 ENCOUNTER — Ambulatory Visit (HOSPITAL_BASED_OUTPATIENT_CLINIC_OR_DEPARTMENT_OTHER): Payer: Medicare HMO | Admitting: Anesthesiology

## 2022-10-11 ENCOUNTER — Encounter (HOSPITAL_BASED_OUTPATIENT_CLINIC_OR_DEPARTMENT_OTHER): Payer: Self-pay | Admitting: Obstetrics and Gynecology

## 2022-10-11 ENCOUNTER — Ambulatory Visit (HOSPITAL_BASED_OUTPATIENT_CLINIC_OR_DEPARTMENT_OTHER)
Admission: RE | Admit: 2022-10-11 | Discharge: 2022-10-11 | Disposition: A | Discharge status: Home / Self Care | Payer: Medicare HMO | Source: Ambulatory Visit | Attending: Obstetrics and Gynecology | Admitting: Obstetrics and Gynecology

## 2022-10-11 ENCOUNTER — Other Ambulatory Visit: Payer: Self-pay

## 2022-10-11 DIAGNOSIS — J45909 Unspecified asthma, uncomplicated: Secondary | ICD-10-CM | POA: Diagnosis not present

## 2022-10-11 DIAGNOSIS — K219 Gastro-esophageal reflux disease without esophagitis: Secondary | ICD-10-CM | POA: Insufficient documentation

## 2022-10-11 DIAGNOSIS — I10 Essential (primary) hypertension: Secondary | ICD-10-CM | POA: Diagnosis not present

## 2022-10-11 DIAGNOSIS — N858 Other specified noninflammatory disorders of uterus: Secondary | ICD-10-CM | POA: Insufficient documentation

## 2022-10-11 DIAGNOSIS — Z87891 Personal history of nicotine dependence: Secondary | ICD-10-CM | POA: Insufficient documentation

## 2022-10-11 DIAGNOSIS — E669 Obesity, unspecified: Secondary | ICD-10-CM | POA: Diagnosis not present

## 2022-10-11 DIAGNOSIS — Z6836 Body mass index (BMI) 36.0-36.9, adult: Secondary | ICD-10-CM | POA: Insufficient documentation

## 2022-10-11 DIAGNOSIS — R9389 Abnormal findings on diagnostic imaging of other specified body structures: Secondary | ICD-10-CM

## 2022-10-11 DIAGNOSIS — N840 Polyp of corpus uteri: Secondary | ICD-10-CM | POA: Diagnosis not present

## 2022-10-11 DIAGNOSIS — Z79899 Other long term (current) drug therapy: Secondary | ICD-10-CM | POA: Diagnosis not present

## 2022-10-11 DIAGNOSIS — Z7985 Long-term (current) use of injectable non-insulin antidiabetic drugs: Secondary | ICD-10-CM | POA: Insufficient documentation

## 2022-10-11 DIAGNOSIS — N950 Postmenopausal bleeding: Secondary | ICD-10-CM

## 2022-10-11 DIAGNOSIS — E119 Type 2 diabetes mellitus without complications: Secondary | ICD-10-CM | POA: Diagnosis not present

## 2022-10-11 DIAGNOSIS — Z01818 Encounter for other preprocedural examination: Secondary | ICD-10-CM

## 2022-10-11 DIAGNOSIS — Z7984 Long term (current) use of oral hypoglycemic drugs: Secondary | ICD-10-CM | POA: Insufficient documentation

## 2022-10-11 DIAGNOSIS — R897 Abnormal histological findings in specimens from other organs, systems and tissues: Secondary | ICD-10-CM | POA: Diagnosis not present

## 2022-10-11 HISTORY — DX: Presence of spectacles and contact lenses: Z97.3

## 2022-10-11 HISTORY — DX: Unspecified osteoarthritis, unspecified site: M19.90

## 2022-10-11 HISTORY — PX: HYSTEROSCOPY WITH D & C: SHX1775

## 2022-10-11 HISTORY — DX: Abnormal uterine and vaginal bleeding, unspecified: N93.9

## 2022-10-11 HISTORY — DX: Type 2 diabetes mellitus without complications: E11.9

## 2022-10-11 HISTORY — DX: Unspecified right bundle-branch block: I45.10

## 2022-10-11 LAB — POCT I-STAT, CHEM 8
BUN: 34 mg/dL — ABNORMAL HIGH (ref 8–23)
Calcium, Ion: 1.18 mmol/L (ref 1.15–1.40)
Chloride: 101 mmol/L (ref 98–111)
Creatinine, Ser: 0.8 mg/dL (ref 0.44–1.00)
Glucose, Bld: 156 mg/dL — ABNORMAL HIGH (ref 70–99)
HCT: 43 % (ref 36.0–46.0)
Hemoglobin: 14.6 g/dL (ref 12.0–15.0)
Potassium: 4.9 mmol/L (ref 3.5–5.1)
Sodium: 138 mmol/L (ref 135–145)
TCO2: 28 mmol/L (ref 22–32)

## 2022-10-11 LAB — GLUCOSE, CAPILLARY: Glucose-Capillary: 138 mg/dL — ABNORMAL HIGH (ref 70–99)

## 2022-10-11 SURGERY — DILATATION AND CURETTAGE /HYSTEROSCOPY
Anesthesia: General | Site: Vagina

## 2022-10-11 MED ORDER — OXYCODONE HCL 5 MG/5ML PO SOLN: 5.0000 mg | Freq: Once | ORAL | Status: DC | PRN

## 2022-10-11 MED ORDER — ONDANSETRON HCL 4 MG/2ML IJ SOLN
INTRAMUSCULAR | Status: DC | PRN
  Administered 2022-10-11: 4 mg via INTRAVENOUS

## 2022-10-11 MED ORDER — OXYCODONE HCL 5 MG PO TABS: 5.0000 mg | ORAL_TABLET | Freq: Once | ORAL | Status: DC | PRN

## 2022-10-11 MED ORDER — LIDOCAINE HCL (PF) 2 % IJ SOLN
INTRAMUSCULAR | Status: AC
  Filled 2022-10-11: qty 5

## 2022-10-11 MED ORDER — FENTANYL CITRATE (PF) 100 MCG/2ML IJ SOLN
INTRAMUSCULAR | Status: AC
  Filled 2022-10-11: qty 2

## 2022-10-11 MED ORDER — ACETAMINOPHEN 10 MG/ML IV SOLN: 1000.0000 mg | Freq: Once | INTRAVENOUS | Status: DC | PRN

## 2022-10-11 MED ORDER — FENTANYL CITRATE (PF) 100 MCG/2ML IJ SOLN: 25.0000 ug | INTRAMUSCULAR | Status: DC | PRN

## 2022-10-11 MED ORDER — SODIUM CHLORIDE 0.9 % IR SOLN
Status: DC | PRN
  Administered 2022-10-11: 1000 mL

## 2022-10-11 MED ORDER — PROPOFOL 10 MG/ML IV BOLUS
INTRAVENOUS | Status: DC | PRN
  Administered 2022-10-11: 200 mg via INTRAVENOUS

## 2022-10-11 MED ORDER — PROPOFOL 10 MG/ML IV BOLUS
INTRAVENOUS | Status: AC
  Filled 2022-10-11: qty 20

## 2022-10-11 MED ORDER — LIDOCAINE HCL 1 % IJ SOLN
INTRAMUSCULAR | Status: DC | PRN
  Administered 2022-10-11: 10 mL

## 2022-10-11 MED ORDER — IBUPROFEN 600 MG PO TABS: 600.0000 mg | ORAL_TABLET | Freq: Four times a day (QID) | ORAL | 0 refills | Status: AC | PRN

## 2022-10-11 MED ORDER — LACTATED RINGERS IV SOLN: INTRAVENOUS | Status: DC

## 2022-10-11 MED ORDER — ONDANSETRON HCL 4 MG/2ML IJ SOLN
INTRAMUSCULAR | Status: AC
  Filled 2022-10-11: qty 2

## 2022-10-11 MED ORDER — LIDOCAINE HCL (CARDIAC) PF 100 MG/5ML IV SOSY
PREFILLED_SYRINGE | INTRAVENOUS | Status: DC | PRN
  Administered 2022-10-11: 40 mg via INTRAVENOUS

## 2022-10-11 MED ORDER — ONDANSETRON HCL 4 MG/2ML IJ SOLN: 4.0000 mg | Freq: Once | INTRAMUSCULAR | Status: DC | PRN

## 2022-10-11 MED ORDER — DEXAMETHASONE SODIUM PHOSPHATE 4 MG/ML IJ SOLN
INTRAMUSCULAR | Status: DC | PRN
  Administered 2022-10-11: 5 mg via INTRAVENOUS

## 2022-10-11 MED ORDER — FENTANYL CITRATE (PF) 100 MCG/2ML IJ SOLN
INTRAMUSCULAR | Status: DC | PRN
  Administered 2022-10-11: 25 ug via INTRAVENOUS
  Administered 2022-10-11: 50 ug via INTRAVENOUS
  Administered 2022-10-11: 25 ug via INTRAVENOUS

## 2022-10-11 MED ORDER — DEXAMETHASONE SODIUM PHOSPHATE 10 MG/ML IJ SOLN
INTRAMUSCULAR | Status: AC
  Filled 2022-10-11: qty 1

## 2022-10-11 MED ORDER — POVIDONE-IODINE 10 % EX SWAB: 2.0000 | Freq: Once | CUTANEOUS | Status: DC

## 2022-10-11 MED ORDER — KETOROLAC TROMETHAMINE 30 MG/ML IJ SOLN
INTRAMUSCULAR | Status: DC | PRN
  Administered 2022-10-11: 30 mg via INTRAVENOUS

## 2022-10-11 SURGICAL SUPPLY — 22 items
BIPOLAR CUTTING LOOP 21FR (ELECTRODE)
CATH ROBINSON RED A/P 16FR (CATHETERS) ×1 IMPLANT
DEVICE MYOSURE LITE (MISCELLANEOUS) IMPLANT
DEVICE MYOSURE REACH (MISCELLANEOUS) IMPLANT
DILATOR CANAL MILEX (MISCELLANEOUS) IMPLANT
DRSG TELFA 3X8 NADH STRL (GAUZE/BANDAGES/DRESSINGS) ×1 IMPLANT
GAUZE 4X4 16PLY ~~LOC~~+RFID DBL (SPONGE) ×2 IMPLANT
GLOVE BIO SURGEON STRL SZ 6.5 (GLOVE) ×1 IMPLANT
GLOVE BIOGEL PI IND STRL 7.0 (GLOVE) ×1 IMPLANT
GOWN STRL REUS W/TWL LRG LVL3 (GOWN DISPOSABLE) ×1 IMPLANT
KIT PROCEDURE FLUENT (KITS) ×1 IMPLANT
KIT TURNOVER CYSTO (KITS) ×1 IMPLANT
LOOP CUTTING BIPOLAR 21FR (ELECTRODE) IMPLANT
PACK VAGINAL MINOR WOMEN LF (CUSTOM PROCEDURE TRAY) ×1 IMPLANT
PAD OB MATERNITY 4.3X12.25 (PERSONAL CARE ITEMS) ×1 IMPLANT
PAD PREP 24X48 CUFFED NSTRL (MISCELLANEOUS) ×1 IMPLANT
SEAL ROD LENS SCOPE MYOSURE (ABLATOR) ×1 IMPLANT
SOL PREP POV-IOD 4OZ 10% (MISCELLANEOUS) IMPLANT
SYR 20ML LL LF (SYRINGE) IMPLANT
TOWEL OR 17X24 6PK STRL BLUE (TOWEL DISPOSABLE) IMPLANT
TOWEL OR 17X26 10 PK STRL BLUE (TOWEL DISPOSABLE) ×2 IMPLANT
WATER STERILE IRR 500ML POUR (IV SOLUTION) ×1 IMPLANT

## 2022-10-11 NOTE — Anesthesia Procedure Notes (Signed)
Procedure Name: LMA Insertion Date/Time: 10/11/2022 9:46 AM  Performed by: Justice Rocher, CRNAPre-anesthesia Checklist: Patient identified, Emergency Drugs available, Suction available, Patient being monitored and Timeout performed Patient Re-evaluated:Patient Re-evaluated prior to induction Oxygen Delivery Method: Circle system utilized Preoxygenation: Pre-oxygenation with 100% oxygen Induction Type: IV induction Ventilation: Mask ventilation without difficulty LMA: LMA inserted LMA Size: 4.0 Number of attempts: 1 Airway Equipment and Method: Bite block Placement Confirmation: positive ETCO2, breath sounds checked- equal and bilateral and CO2 detector Tube secured with: Tape Dental Injury: Teeth and Oropharynx as per pre-operative assessment

## 2022-10-11 NOTE — Transfer of Care (Signed)
Immediate Anesthesia Transfer of Care Note  Patient: Margaret Cisneros  Procedure(s) Performed: Procedure(s) (LRB): DILATATION AND CURETTAGE /HYSTEROSCOPY (N/A)  Patient Location: PACU  Anesthesia Type: General  Level of Consciousness: awake, sedated, patient cooperative and responds to stimulation  Airway & Oxygen Therapy: Patient Spontanous Breathing and Patient connected to [place] oxygen  Post-op Assessment: Report given to PACU RN, Post -op Vital signs reviewed and stable and Patient moving all extremities  Post vital signs: Reviewed and stable  Complications: No apparent anesthesia complications

## 2022-10-11 NOTE — Op Note (Signed)
Pre-operative Diagnosis: Postmenopausal bleeding Specimen with abnormal presence of endometrial cells Abnormal finding on endometrial biopsy Body mass index 30+ - obesity Endometrium thickened  Post-operative Diagnosis: same  Surgeon: Surgeon(s) and Role:    * Josefine Class, MD - Primary   Procedure and Anesthesia:  Procedure(s) and Anesthesia Type:    * DILATATION AND CURETTAGE /HYSTEROSCOPY - General  ASA Class: 2   ASSISTANTS: none   ANESTHESIA:   General and paracervical block  ESTIMATED BLOOD LOSS: 10 cc  LOCAL MEDICATIONS USED:  1% lidocaine 10 cc  SPECIMEN:  Source of Specimen:  endometrial curettings  DISPOSITION OF SPECIMEN:  PATHOLOGY  COUNTS Correct:  YES   DICTATION: The patient was taken to the operating room and prepped and draped in a normal sterile fashion. Patient voided en route to OR.  A weighted speculum was placed into the vagina and anterior lip of the cervix was grasped with a single-tooth tenaculum.  The uterus sounded to 33m. The cervix was then dilated with Pratt dilators up to 19. The Myosure hysteroscope was placed into the uterine cavity. The  entire uterus and both ostia were visualized. Endometrium was noted to be thickened and irregular with no active bleeding. Myosure lite device placed in hysteroscopy and curettage was performed. Fluid deficit was 395 mL. Hysteroscope was then removed from the uterus. A sharp curettage was then done with a curette. All the endometrial curettings were sent to pathology. The hysteroscope was not reintroduced. 10 cc of 1% xylocaine was used for cervical block. The tenaculum was removed from the cervix and hemostasis was noted.    PLAN OF CARE: discharge to home  PATIENT DISPOSITION:  PACU - hemodynamically stable.   Dr. EBing Matter

## 2022-10-11 NOTE — H&P (Addendum)
Margaret Cisneros is an 71 y.o. female. presenting for D&C hysteroscopy for postmenopausal bleeding and abnormal EMB. Pathology report from EMB on 09/20/2022 demonstrate Disordered proliferative endometrium with tubal metaplasia, negative for hyperplasia/atypia. Called and discussed results with pathologist who agrees that this cell type is abnormal in postmenopausal women. Reassuring that cells do not appear consist with hyperplasia or malignancy. Will proceed with scheduling patient for [facility] hysteroscopy for further evaluation. Discussed r/b/a of procedure including but not limited to infection, bleeding, uterine perforation. Patient verbalizes understanding and wishes to proceed.   Past Medical History:  Diagnosis Date   Abnormal uterine bleeding (AUB)    abnormal endometrial biopsy  10/ 2023   Asthma    Family history of adverse reaction to anesthesia    sister-- ponv   GERD (gastroesophageal reflux disease)    severe    Hypertension    OA (osteoarthritis)    hips, knees   RBBB (right bundle branch block)    Type 2 diabetes mellitus ([place])    followed by pcp  (10-06-2022  per pt check blood sugar daily in am fasting,  average 130--170)   Wears glasses    Wears glasses     Past Surgical History:  Procedure Laterality Date   TOTAL HIP ARTHROPLASTY Left 04/06/2018   Procedure: LEFT TOTAL HIP ARTHROPLASTY ANTERIOR APPROACH;  Surgeon: Rod Can, MD;  Location: [facility];  Service: Orthopedics;  Laterality: Left;  Needs RNFA   TOTAL KNEE ARTHROPLASTY Left 02/14/2019   Procedure: TOTAL KNEE ARTHROPLASTY;  Surgeon: Susa Day, MD;  Location: [facility];  Service: Orthopedics;  Laterality: Left;  150 mins   TOTAL KNEE ARTHROPLASTY Right 07/22/2021   Procedure: TOTAL KNEE ARTHROPLASTY;  Surgeon: Susa Day, MD;  Location: [facility];  Service: Orthopedics;  Laterality: Right;    Family History  Problem Relation Age of Onset   Breast cancer Mother    Breast cancer Sister     Social  History:  reports that she quit smoking about 23 years ago. Her smoking use included cigarettes. She has never used smokeless tobacco. She reports that she does not currently use alcohol. She reports that she does not use drugs.  Allergies: No Known Allergies  Medications Prior to Admission  Medication Sig Dispense Refill Last Dose   albuterol (PROVENTIL HFA;VENTOLIN HFA) 108 (90 Base) MCG/ACT inhaler Inhale 2 puffs into the lungs every 6 (six) hours as needed for wheezing or shortness of breath.   10/10/2022   celecoxib (CELEBREX) 200 MG capsule Take 200 mg by mouth daily.    10/10/2022   Dulaglutide (TRULICITY) 3 NG/2.9BM SOPN Inject 3 mg into the skin once a week. Monday's   10/04/2022   empagliflozin (JARDIANCE) 25 MG TABS tablet Take 25 mg by mouth daily.   10/10/2022   Ergocalciferol (VITAMIN D2) 50 MCG (2000 UT) TABS Take 2,000 Units by mouth daily.   10/10/2022   gabapentin (NEURONTIN) 100 MG capsule Take 100 mg by mouth 3 (three) times daily.   10/10/2022   hydrochlorothiazide (HYDRODIURIL) 25 MG tablet Take 25 mg by mouth daily.   10/10/2022   losartan (COZAAR) 100 MG tablet Take 100 mg by mouth daily.   10/10/2022   metFORMIN (GLUCOPHAGE) 500 MG tablet Take 500 mg by mouth 2 (two) times daily.   10/10/2022   omeprazole (PRILOSEC) 40 MG capsule Take 40 mg by mouth daily.   10/10/2022   triamcinolone cream (KENALOG) 0.5 % Apply 1 application topically 2 (two) times daily as needed for  rash.   10/10/2022   fluticasone (FLONASE) 50 MCG/ACT nasal spray Place 2 sprays into both nostrils daily as needed.   More than a month   loratadine (CLARITIN) 10 MG tablet Take 10 mg by mouth daily as needed.   More than a month   Polyethylene Glycol 400 (BLINK TEARS OP) Place 1 drop into both eyes daily as needed (dry eyes).   More than a month    Review of Systems  Constitutional:        Pertinent ROS noted in HPI  All other systems reviewed and are negative.   Blood pressure (!) 146/97, pulse 97,  temperature 98 F (36.7 C), temperature source Oral, resp. rate 19, height '5\' 8"'$  (1.727 m), weight 107.4 kg, SpO2 96 %. Physical Exam Constitutional:      Appearance: Normal appearance. She is obese.  Cardiovascular:     Rate and Rhythm: Normal rate.  Pulmonary:     Effort: Pulmonary effort is normal. No respiratory distress.  Abdominal:     General: Abdomen is flat. Bowel sounds are normal.     Palpations: Abdomen is soft.  Neurological:     General: No focal deficit present.     Mental Status: She is alert and oriented to person, place, and time.  Psychiatric:        Mood and Affect: Mood normal.    No results found for this or any previous visit (from the past 24 hour(s)).  No results found.  Assessment/Plan: 71 y.o. female. presenting for D&C hysteroscopy for postmenopausal bleeding and abnormal EMB Body mass index 30+ - obesity Postmenopausal bleeding Endometrium thickened Specimen with abnormal presence of endometrial cells  Patient seen and evaluated at bedside. Will proceed with scheduling patient for [facility] hysteroscopy for further evaluation. Discussed r/b/a of procedure including but not limited to infection, bleeding, uterine perforation. Patient verbalizes understanding and wishes to proceed.   Josefine Class 10/11/2022, 7:36 AM

## 2022-10-11 NOTE — Anesthesia Postprocedure Evaluation (Signed)
Anesthesia Post Note  Patient: [name]  Procedure(s) Performed: DILATATION AND CURETTAGE /HYSTEROSCOPY (Vagina )     Patient location during evaluation: PACU Anesthesia Type: General Level of consciousness: awake and alert Pain management: pain level controlled Vital Signs Assessment: post-procedure vital signs reviewed and stable Respiratory status: spontaneous breathing, nonlabored ventilation, respiratory function stable and patient connected to nasal cannula oxygen Cardiovascular status: blood pressure returned to baseline and stable Postop Assessment: no apparent nausea or vomiting Anesthetic complications: no  No notable events documented.  Last Vitals:  Vitals:   10/11/22 1022 10/11/22 1030  BP: (!) 162/94 (!) 143/92  Pulse: 92 88  Resp: 20 (!) 24  Temp: 36.6 C   SpO2: 98% 98%    Last Pain:  Vitals:   10/11/22 1030  TempSrc:   PainSc: 0-No pain                 Duanne Duchesne S

## 2022-10-11 NOTE — Anesthesia Preprocedure Evaluation (Signed)
Anesthesia Evaluation  Patient identified by MRN, date of birth, ID band Patient awake    Reviewed: Allergy & Precautions, H&P , NPO status , Patient's Chart, lab work & pertinent test results  Airway Mallampati: II  TM Distance: <3 FB Neck ROM: Full    Dental no notable dental hx.    Pulmonary asthma , former smoker   Pulmonary exam normal breath sounds clear to auscultation       Cardiovascular hypertension, Pt. on medications Normal cardiovascular exam Rhythm:Regular Rate:Normal     Neuro/Psych negative neurological ROS  negative psych ROS   GI/Hepatic Neg liver ROS,GERD  Medicated,,  Endo/Other  diabetes, Type 2    Renal/GU negative Renal ROS  negative genitourinary   Musculoskeletal negative musculoskeletal ROS (+)    Abdominal   Peds negative pediatric ROS (+)  Hematology negative hematology ROS (+)   Anesthesia Other Findings   Reproductive/Obstetrics negative OB ROS                             Anesthesia Physical Anesthesia Plan  ASA: 2  Anesthesia Plan: General   Post-op Pain Management: Minimal or no pain anticipated   Induction: Intravenous  PONV Risk Score and Plan: 3 and Ondansetron, Dexamethasone, Droperidol and Treatment may vary due to age or medical condition  Airway Management Planned: LMA  Additional Equipment:   Intra-op Plan:   Post-operative Plan: Extubation in OR  Informed Consent: I have reviewed the patients History and Physical, chart, labs and discussed the procedure including the risks, benefits and alternatives for the proposed anesthesia with the patient or authorized representative who has indicated his/her understanding and acceptance.     Dental advisory given  Plan Discussed with: CRNA and Surgeon  Anesthesia Plan Comments:        Anesthesia Quick Evaluation

## 2022-10-11 NOTE — Discharge Instructions (Signed)
No ibuprofen, Advil, Aleve, Motrin, ketorolac, meloxicam, naproxen, or other NSAIDS until after 4:15pm today if needed for pain.    DISCHARGE INSTRUCTIONS: HYSTEROSCOPY  The following instructions have been prepared to help you care for yourself upon your return home.   Personal hygiene:  Use sanitary pads for vaginal drainage, not tampons.  Shower the day after your procedure.  NO tub baths, pools or Jacuzzis for 2-3 weeks.  Wipe front to back after using the bathroom.  Activity and limitations:  Do NOT drive or operate any equipment for 24 hours. The effects of anesthesia are still present and drowsiness may result.  Do NOT rest in bed all day.  Walking is encouraged.  Walk up and down stairs slowly.  You may resume your normal activity in one to two days or as indicated by your physician. Sexual activity: NO intercourse for at least 2 weeks after the procedure, or as indicated by your Doctor.  Diet: Eat a light meal as desired this evening. You may resume your usual diet tomorrow.  Return to Work: You may resume your work activities in one to two days or as indicated by Marine scientist.  What to expect after your surgery: Expect to have vaginal bleeding/discharge for 2-3 days and spotting for up to 10 days. It is not unusual to have soreness for up to 1-2 weeks. You may have a slight burning sensation when you urinate for the first day. Mild cramps may continue for a couple of days. You may have a regular period in 2-6 weeks.  Call your doctor for any of the following:  Excessive vaginal bleeding or clotting, saturating and changing one pad every hour.  Inability to urinate 6 hours after discharge from hospital.  Pain not relieved by pain medication.  Fever of 100.4 F or greater.  Unusual vaginal discharge or odor.     Post Anesthesia Home Care Instructions  Activity: Get plenty of rest for the remainder of the day. A responsible individual must stay with you for 24  hours following the procedure.  For the next 24 hours, DO NOT: -Drive a car -Paediatric nurse -Drink alcoholic beverages -Take any medication unless instructed by your physician -Make any legal decisions or sign important papers.  Meals: Start with liquid foods such as gelatin or soup. Progress to regular foods as tolerated. Avoid greasy, spicy, heavy foods. If nausea and/or vomiting occur, drink only clear liquids until the nausea and/or vomiting subsides. Call your physician if vomiting continues.  Special Instructions/Symptoms: Your throat may feel dry or sore from the anesthesia or the breathing tube placed in your throat during surgery. If this causes discomfort, gargle with warm salt water. The discomfort should disappear within 24 hours.

## 2022-10-12 ENCOUNTER — Encounter (HOSPITAL_BASED_OUTPATIENT_CLINIC_OR_DEPARTMENT_OTHER): Payer: Self-pay | Admitting: Obstetrics and Gynecology

## 2022-10-12 LAB — SURGICAL PATHOLOGY

## 2022-10-25 HISTORY — PX: INTRAUTERINE DEVICE INSERTION: SHX323

## 2022-11-09 ENCOUNTER — Telehealth: Payer: Self-pay | Admitting: *Deleted

## 2022-11-09 NOTE — Telephone Encounter (Signed)
LMOM for the patient to call the office back for a new patient appt with Dr Berline Lopes on 12/22

## 2022-11-26 DIAGNOSIS — R0981 Nasal congestion: Secondary | ICD-10-CM | POA: Diagnosis not present

## 2022-11-26 DIAGNOSIS — R059 Cough, unspecified: Secondary | ICD-10-CM | POA: Diagnosis not present

## 2022-11-26 DIAGNOSIS — Z20822 Contact with and (suspected) exposure to covid-19: Secondary | ICD-10-CM | POA: Diagnosis not present

## 2022-11-27 DIAGNOSIS — R0981 Nasal congestion: Secondary | ICD-10-CM | POA: Diagnosis not present

## 2022-11-27 DIAGNOSIS — R059 Cough, unspecified: Secondary | ICD-10-CM | POA: Diagnosis not present

## 2022-11-27 DIAGNOSIS — Z20822 Contact with and (suspected) exposure to covid-19: Secondary | ICD-10-CM | POA: Diagnosis not present

## 2022-11-30 ENCOUNTER — Encounter: Payer: Self-pay | Admitting: Gynecologic Oncology

## 2022-12-03 ENCOUNTER — Encounter: Payer: Self-pay | Admitting: Gynecologic Oncology

## 2022-12-03 ENCOUNTER — Inpatient Hospital Stay: Payer: Medicare HMO | Attending: Gynecologic Oncology | Admitting: Gynecologic Oncology

## 2022-12-03 ENCOUNTER — Other Ambulatory Visit: Payer: Self-pay

## 2022-12-03 VITALS — BP 121/57 | HR 99 | Temp 98.4°F | Resp 18 | Ht 68.0 in | Wt 236.5 lb

## 2022-12-03 DIAGNOSIS — Z803 Family history of malignant neoplasm of breast: Secondary | ICD-10-CM | POA: Insufficient documentation

## 2022-12-03 DIAGNOSIS — E1169 Type 2 diabetes mellitus with other specified complication: Secondary | ICD-10-CM

## 2022-12-03 DIAGNOSIS — N858 Other specified noninflammatory disorders of uterus: Secondary | ICD-10-CM | POA: Insufficient documentation

## 2022-12-03 DIAGNOSIS — Z791 Long term (current) use of non-steroidal anti-inflammatories (NSAID): Secondary | ICD-10-CM | POA: Insufficient documentation

## 2022-12-03 DIAGNOSIS — N950 Postmenopausal bleeding: Secondary | ICD-10-CM | POA: Insufficient documentation

## 2022-12-03 DIAGNOSIS — Z7984 Long term (current) use of oral hypoglycemic drugs: Secondary | ICD-10-CM | POA: Diagnosis not present

## 2022-12-03 DIAGNOSIS — E669 Obesity, unspecified: Secondary | ICD-10-CM | POA: Insufficient documentation

## 2022-12-03 DIAGNOSIS — I4510 Unspecified right bundle-branch block: Secondary | ICD-10-CM | POA: Insufficient documentation

## 2022-12-03 DIAGNOSIS — Z79899 Other long term (current) drug therapy: Secondary | ICD-10-CM | POA: Insufficient documentation

## 2022-12-03 DIAGNOSIS — Z7985 Long-term (current) use of injectable non-insulin antidiabetic drugs: Secondary | ICD-10-CM | POA: Diagnosis not present

## 2022-12-03 DIAGNOSIS — E119 Type 2 diabetes mellitus without complications: Secondary | ICD-10-CM | POA: Insufficient documentation

## 2022-12-03 DIAGNOSIS — K219 Gastro-esophageal reflux disease without esophagitis: Secondary | ICD-10-CM | POA: Insufficient documentation

## 2022-12-03 DIAGNOSIS — M1990 Unspecified osteoarthritis, unspecified site: Secondary | ICD-10-CM | POA: Diagnosis not present

## 2022-12-03 DIAGNOSIS — Z780 Asymptomatic menopausal state: Secondary | ICD-10-CM | POA: Insufficient documentation

## 2022-12-03 DIAGNOSIS — I10 Essential (primary) hypertension: Secondary | ICD-10-CM | POA: Insufficient documentation

## 2022-12-03 DIAGNOSIS — E280 Estrogen excess: Secondary | ICD-10-CM | POA: Diagnosis not present

## 2022-12-03 DIAGNOSIS — Z6835 Body mass index (BMI) 35.0-35.9, adult: Secondary | ICD-10-CM | POA: Diagnosis not present

## 2022-12-03 DIAGNOSIS — J45909 Unspecified asthma, uncomplicated: Secondary | ICD-10-CM | POA: Insufficient documentation

## 2022-12-03 NOTE — Progress Notes (Signed)
GYNECOLOGIC ONCOLOGY NEW PATIENT CONSULTATION   Patient Name: Margaret Cisneros  Patient Age: 71 y.o. Date of Service: 12/03/22 Referring Provider: Bing Matter, DO  Primary Care Provider: Nolene Ebbs, MD Consulting Provider: Jeral Pinch, MD   Assessment/Plan:  Postmenopausal patient with recurrent postmenopausal bleeding with recent pathology revealing disordered proliferative endometium.  Patient presented with her sister today.  We discussed in detail her menstrual history and more recent bleeding.  She has what sounds like a long history of abnormal uterine bleeding with postmenopausal bleeding previously treated with progesterone.  She developed recurrent bleeding earlier this year with multiple episodes.  Endometrial biopsy in clinic showed disordered proliferative endometrium with tubal metaplasia.  Given concern that tubal metaplasia could represent underlying hyperplasia or malignancy, the patient was taken for further sampling in the OR.  On hysteroscopy, she was noted to have polypoid tissue within the endometrium.  Final pathology from her D&C showed disordered endometrium, focally polypoid, no hyperplasia or malignancy.  We discussed that overall tubal metaplasia within the endometrium is thought to be a benign lesion.  There are some reports that suggest that this finding may represent a premalignant lesion and has been associated with hyperplasia and malignancy.  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  We reviewed the role that unopposed estrogen plays in the pathogenesis of overgrowth of the endometrium, endometrial hyperplasia, and endometrial malignancy.  The patient has multiple risk factors for endometrial hyperplasia and malignancy including her obesity, diabetes, and what I suspect was late menopause.  In terms of management we discussed several options.  The first would be to proceed with close surveillance.   It would be very important for the patient to let her OB/GYN know if she developed any bleeding or pelvic symptoms in the future.  The second option would be to treat her suspected estrogen dominance with progesterone.  This could be in the form of oral progesterone or Mirena IUD.  We discussed the benefits and side effects of both progesterone options.  Given visualized polyps as well as polypoid features on pathology, having her on progesterone may also help prevent reformation of polyps.  Lastly, we discussed the option of definitive surgery with hysterectomy.  Given her extensive sampling, I think it is unlikely that there has been a missed diagnosis of either a precancer or cancerous lesion.  We reviewed risks related to major surgery, especially in the setting of her comorbidities.  I recommended that the patient take some time to think about what she would like to do and reach back out to her OB/GYN for a visit in the near future.  It sounds like she may have a history of abnormal Pap smears.  It may be worth pursuing trying to get copies of prior Pap smears if this is possible.  This would help delineate whether patient needs continued Pap surveillance.  A copy of this note was sent to the patient's referring provider.   55 minutes of total time was spent for this patient encounter, including preparation, face-to-face counseling with the patient and coordination of care, and documentation of the encounter.  Jeral Pinch, MD  Division of Gynecologic Oncology  Department of Obstetrics and Gynecology  [facility]  ___________________________________________  Chief Complaint: No chief complaint on file.   History of Present Illness:  Margaret Cisneros is a 71 y.o. y.o. female who is seen in consultation at the request of Dr. Ivin Poot for an evaluation  of postmenopausal bleeding with proliferative endometrium with tubal metaplasia seen on recent biopsy.  The  patient is noted to have a long history of abnormal uterine bleeding on Provera.  She notes that this was started after the birth of her son 30 years ago.  She was seen in 2012 after stopping her Depo-Provera with episodes of heavy vaginal bleeding.  She had an ultrasound at that time with a uterus measuring 12 x 6 x 7 cm with an endometrial lining measuring just under 8 mm.  The endocervical canal was distended with heterogeneous soft tissue measuring 4 x 8 cm suggestive of blood products versus polyp or aborting submucosal fibroid.  The patient was started on Provera.  Endometrial biopsy showed benign inactive endometrium with pseudo decidualized stroma consistent with exogenous hormone effect.  No atypia, hyperplasia, or malignancy seen.  She was on Provera for short course and was restarted on Depo-Provera which she took for 1 or 2 years after.  This was then stopped and she did not experience any vaginal bleeding until March of this year.  She had an episode of vaginal bleeding in March, which stopped spontaneously.  She again started bleeding August 15 and stopped September 12.  She describes bleeding episodes as heavy with associated cramping and passage of golf ball sized clots.  She wears several pads a day which are nearly or fully saturated.  Since mid September, she denies any vaginal bleeding or cramping.  She did well after both her biopsy and D&C procedure.  Pap 08/2022: Atypical squamous cells of undetermined significance, high risk HPV negative. EMB 09/15/22: Disordered proliferative endometrium with tubal metaplasia, no hyperplasia or atypia. Pelvic ultrasound exam at [facility] on 09/20/2022: Uterus measures 8.8 x 6.9 x 6.5 cm.  Endometrium is thickened measuring 8.03 mm.  Myometrium is heterogenous possibly reflecting adenomyosis.  Left ovary normal in appearance.  Right ovary not visualized. Endometrial curettage 10/12/2022: Findings at the time of surgery were that the  endometrium was thickened and irregular with no active bleeding.  MyoSure was used to perform curettage under direct visualization.  Sharp curettage was then also performed.  Final pathology revealed disordered endometrium with weakly reactive glands, focally polypoid.  No endometrial hyperplasia, EIN, or malignancy identified.  Benign squamous cervical fragments without identified dysplasia.  Patient reports a good appetite without nausea or emesis.  She reports normal bowel bladder function.  Both the patient's mother and sister have a history of breast cancer.  Her sister was diagnosed last year and had genetic testing which she reports is negative.  The patient presents today to her visit with the sister.  PAST MEDICAL HISTORY:  Past Medical History:  Diagnosis Date   Abnormal uterine bleeding (AUB)    abnormal endometrial biopsy  10/ 2023   Asthma    not current   Family history of adverse reaction to anesthesia    sister-- ponv   GERD (gastroesophageal reflux disease)    severe    Hypertension    OA (osteoarthritis)    hips, knees   RBBB (right bundle branch block)    Type 2 diabetes mellitus ([place])    followed by pcp  (10-06-2022  per pt check blood sugar daily in am fasting,  average 130--170)   Wears glasses    Wears glasses      PAST SURGICAL HISTORY:  Past Surgical History:  Procedure Laterality Date   HYSTEROSCOPY WITH D & C N/A 10/11/2022   Procedure: DILATATION AND CURETTAGE /HYSTEROSCOPY;  Surgeon: Josefine Class, MD;  Location: [facility];  Service: Gynecology;  Laterality: N/A;   TOTAL HIP ARTHROPLASTY Left 04/06/2018   Procedure: LEFT TOTAL HIP ARTHROPLASTY ANTERIOR APPROACH;  Surgeon: Rod Can, MD;  Location: [facility];  Service: Orthopedics;  Laterality: Left;  Needs RNFA   TOTAL KNEE ARTHROPLASTY Left 02/14/2019   Procedure: TOTAL KNEE ARTHROPLASTY;  Surgeon: Susa Day, MD;  Location: [facility];  Service: Orthopedics;  Laterality: Left;   150 mins   TOTAL KNEE ARTHROPLASTY Right 07/22/2021   Procedure: TOTAL KNEE ARTHROPLASTY;  Surgeon: Susa Day, MD;  Location: [facility];  Service: Orthopedics;  Laterality: Right;    OB/GYN HISTORY:  OB History  Gravida Para Term Preterm AB Living  4 4          SAB IAB Ectopic Multiple Live Births               # Outcome Date GA Lbr Len/2nd Weight Sex Delivery Anes PTL Lv  4 Para           3 Para           2 Para           1 Para             No LMP recorded (lmp unknown). Patient is postmenopausal.  Age at menarche: as a teenager  Age at menopause: 2012 although was on Depo provera at the time so is unsure when she went through menopause Hx of HRT: Denies Hx of STDs: denies Last pap: 08/2022 - ASCUS, HR NPV negative History of abnormal pap smears: last pap before this was 2012 and normal. Patient denies history of abnormal pap tests but note from 2012 states that pap test in 2011 was abnormal (should have been followed-up with colposcopy but wasn't).  SCREENING STUDIES:  Last mammogram: 2023  Last colonoscopy: has not had  MEDICATIONS: Outpatient Encounter Medications as of 12/03/2022  Medication Sig   albuterol (PROVENTIL HFA;VENTOLIN HFA) 108 (90 Base) MCG/ACT inhaler Inhale 2 puffs into the lungs every 6 (six) hours as needed for wheezing or shortness of breath.   celecoxib (CELEBREX) 200 MG capsule Take 200 mg by mouth daily.    Dulaglutide (TRULICITY) 3 PJ/8.2NK SOPN Inject 3 mg into the skin once a week. Monday's   empagliflozin (JARDIANCE) 25 MG TABS tablet Take 25 mg by mouth daily.   Ergocalciferol (VITAMIN D2) 50 MCG (2000 UT) TABS Take 2,000 Units by mouth daily.   fluticasone (FLONASE) 50 MCG/ACT nasal spray Place 2 sprays into both nostrils daily as needed.   gabapentin (NEURONTIN) 100 MG capsule Take 100 mg by mouth 3 (three) times daily.   hydrochlorothiazide (HYDRODIURIL) 25 MG tablet Take 25 mg by mouth daily.   loratadine (CLARITIN) 10 MG tablet Take 10  mg by mouth daily as needed.   losartan (COZAAR) 100 MG tablet Take 100 mg by mouth daily.   metFORMIN (GLUCOPHAGE) 500 MG tablet Take 500 mg by mouth 2 (two) times daily.   omeprazole (PRILOSEC) 40 MG capsule Take 40 mg by mouth daily.   triamcinolone cream (KENALOG) 0.5 % Apply 1 application topically 2 (two) times daily as needed for rash.   [DISCONTINUED] Polyethylene Glycol 400 (BLINK TEARS OP) Place 1 drop into both eyes daily as needed (dry eyes).   [DISCONTINUED] celecoxib (CELEBREX) 200 MG capsule daily.   No facility-administered encounter medications on file as of 12/03/2022.    ALLERGIES:  No Known Allergies  FAMILY HISTORY:  Family History  Problem Relation Age of Onset   Breast cancer Mother    Breast cancer Sister      SOCIAL HISTORY:  Social Connections: Not on file    REVIEW OF SYSTEMS:  Denies appetite changes, fevers, chills, fatigue, unexplained weight changes. Denies hearing loss, neck lumps or masses, mouth sores, ringing in ears or voice changes. Denies cough or wheezing.  Denies shortness of breath. Denies chest pain or palpitations. Denies leg swelling. Denies abdominal distention, pain, blood in stools, constipation, diarrhea, nausea, vomiting, or early satiety. Denies pain with intercourse, dysuria, frequency, hematuria or incontinence. Denies hot flashes, pelvic pain, vaginal bleeding or vaginal discharge.   Denies joint pain, back pain or muscle pain/cramps. Denies itching, rash, or wounds. Denies dizziness, headaches, numbness or seizures. Denies swollen lymph nodes or glands, denies easy bruising or bleeding. Denies anxiety, depression, confusion, or decreased concentration.  Physical Exam:  Vital Signs for this encounter:  Blood pressure (!) 121/57, pulse 99, temperature 98.4 F (36.9 C), temperature source Oral, resp. rate 18, height '5\' 8"'$  (1.727 m), weight 236 lb 8 oz (107.3 kg), SpO2 96 %. Body mass index is 35.96 kg/m. General: Alert,  oriented, no acute distress.  HEENT: Normocephalic, atraumatic. Sclera anicteric.  Chest: Clear to auscultation bilaterally. No wheezes, rhonchi, or rales. Cardiovascular: Regular rate and rhythm, no murmurs, rubs, or gallops.  Abdomen: Obese. Normoactive bowel sounds. Soft, nondistended, nontender to palpation. No masses or hepatosplenomegaly appreciated. No palpable fluid wave.  Extremities: Grossly normal range of motion. Warm, well perfused. Trace edema bilaterally.  Skin: No rashes or lesions.  GU: Deferred.  LABORATORY AND RADIOLOGIC DATA:  Outside medical records were reviewed to synthesize the above history, along with the history and physical obtained during the visit.   Lab Results  Component Value Date   WBC 8.9 04/15/2022   HGB 14.6 10/11/2022   HCT 43.0 10/11/2022   PLT 268 04/15/2022   GLUCOSE 156 (H) 10/11/2022   CHOL 174 04/15/2022   TRIG 106 04/15/2022   HDL 55 04/15/2022   LDLCALC 99 04/15/2022   ALT 14 04/15/2022   AST 16 04/15/2022   NA 138 10/11/2022   K 4.9 10/11/2022   CL 101 10/11/2022   CREATININE 0.80 10/11/2022   BUN 34 (H) 10/11/2022   CO2 24 04/15/2022   TSH 1.13 04/15/2022   INR 1.0 02/09/2019   HGBA1C 6.9 (H) 07/09/2021

## 2022-12-03 NOTE — Patient Instructions (Signed)
It was very nice to meet you today.  We discussed your recent biopsies, both the one that was done in clinic as well as the one done at the time of surgery.  These both were biopsies from the lining of your womb or uterus, which is called the endometrium.  While neither showed precancer or cancer, there were findings that would suggest that your body is seeing too much estrogen.  Estrogen is a hormone that typically is made by the ovaries, but some of the other tissue in our bodies can make.  Estrogen causes stimulation of the lining of the uterus and can cause it to get thick, and ultimately developed precancer and cancer.  We discussed several options in terms of your recent bleeding and biopsy results.  The first would be to just follow you closely understanding that you would call your OB/GYN with any new bleeding or other pelvic symptoms.  The second would be to consider starting you on some progesterone to counteract the estrogen and help keep your lining thin (this could be either with oral progesterone or with an IUD).  The third option would be to move forward with hysterectomy.  We discussed the risks and benefits of this major surgery.  Please be sure to follow-up with your OB/GYN within the next couple of weeks.  I will send her my note from today regarding our discussion.

## 2022-12-06 HISTORY — PX: INTRAUTERINE DEVICE INSERTION: SHX323

## 2022-12-07 ENCOUNTER — Other Ambulatory Visit: Payer: Self-pay | Admitting: Internal Medicine

## 2022-12-07 DIAGNOSIS — Z1231 Encounter for screening mammogram for malignant neoplasm of breast: Secondary | ICD-10-CM

## 2022-12-29 DIAGNOSIS — Z30430 Encounter for insertion of intrauterine contraceptive device: Secondary | ICD-10-CM | POA: Diagnosis not present

## 2022-12-29 DIAGNOSIS — N859 Noninflammatory disorder of uterus, unspecified: Secondary | ICD-10-CM | POA: Diagnosis not present

## 2022-12-29 DIAGNOSIS — E669 Obesity, unspecified: Secondary | ICD-10-CM | POA: Diagnosis not present

## 2023-01-10 DIAGNOSIS — M25551 Pain in right hip: Secondary | ICD-10-CM | POA: Diagnosis not present

## 2023-01-25 ENCOUNTER — Ambulatory Visit: Payer: Medicaid Other

## 2023-01-29 DIAGNOSIS — Z20822 Contact with and (suspected) exposure to covid-19: Secondary | ICD-10-CM | POA: Diagnosis not present

## 2023-01-29 DIAGNOSIS — R0981 Nasal congestion: Secondary | ICD-10-CM | POA: Diagnosis not present

## 2023-01-29 DIAGNOSIS — R059 Cough, unspecified: Secondary | ICD-10-CM | POA: Diagnosis not present

## 2023-01-31 DIAGNOSIS — R0981 Nasal congestion: Secondary | ICD-10-CM | POA: Diagnosis not present

## 2023-01-31 DIAGNOSIS — R059 Cough, unspecified: Secondary | ICD-10-CM | POA: Diagnosis not present

## 2023-01-31 DIAGNOSIS — Z20822 Contact with and (suspected) exposure to covid-19: Secondary | ICD-10-CM | POA: Diagnosis not present

## 2023-02-01 DIAGNOSIS — N859 Noninflammatory disorder of uterus, unspecified: Secondary | ICD-10-CM | POA: Diagnosis not present

## 2023-02-01 DIAGNOSIS — E669 Obesity, unspecified: Secondary | ICD-10-CM | POA: Diagnosis not present

## 2023-02-01 DIAGNOSIS — Z30431 Encounter for routine checking of intrauterine contraceptive device: Secondary | ICD-10-CM | POA: Diagnosis not present

## 2023-02-02 DIAGNOSIS — R0981 Nasal congestion: Secondary | ICD-10-CM | POA: Diagnosis not present

## 2023-02-02 DIAGNOSIS — R059 Cough, unspecified: Secondary | ICD-10-CM | POA: Diagnosis not present

## 2023-02-02 DIAGNOSIS — Z20822 Contact with and (suspected) exposure to covid-19: Secondary | ICD-10-CM | POA: Diagnosis not present

## 2023-02-03 DIAGNOSIS — Z20822 Contact with and (suspected) exposure to covid-19: Secondary | ICD-10-CM | POA: Diagnosis not present

## 2023-02-03 DIAGNOSIS — R059 Cough, unspecified: Secondary | ICD-10-CM | POA: Diagnosis not present

## 2023-02-03 DIAGNOSIS — R0981 Nasal congestion: Secondary | ICD-10-CM | POA: Diagnosis not present

## 2023-02-04 ENCOUNTER — Ambulatory Visit
Admission: RE | Admit: 2023-02-04 | Discharge: 2023-02-04 | Disposition: A | Discharge status: Home / Self Care | Payer: Medicare HMO | Source: Ambulatory Visit | Attending: Internal Medicine | Admitting: Internal Medicine

## 2023-02-04 DIAGNOSIS — R0981 Nasal congestion: Secondary | ICD-10-CM | POA: Diagnosis not present

## 2023-02-04 DIAGNOSIS — Z20822 Contact with and (suspected) exposure to covid-19: Secondary | ICD-10-CM | POA: Diagnosis not present

## 2023-02-04 DIAGNOSIS — Z1231 Encounter for screening mammogram for malignant neoplasm of breast: Secondary | ICD-10-CM | POA: Diagnosis not present

## 2023-02-04 DIAGNOSIS — R059 Cough, unspecified: Secondary | ICD-10-CM | POA: Diagnosis not present

## 2023-02-07 DIAGNOSIS — R0981 Nasal congestion: Secondary | ICD-10-CM | POA: Diagnosis not present

## 2023-02-07 DIAGNOSIS — Z20822 Contact with and (suspected) exposure to covid-19: Secondary | ICD-10-CM | POA: Diagnosis not present

## 2023-02-07 DIAGNOSIS — R059 Cough, unspecified: Secondary | ICD-10-CM | POA: Diagnosis not present

## 2023-02-17 DIAGNOSIS — Z0000 Encounter for general adult medical examination without abnormal findings: Secondary | ICD-10-CM | POA: Diagnosis not present

## 2023-02-17 DIAGNOSIS — M169 Osteoarthritis of hip, unspecified: Secondary | ICD-10-CM | POA: Diagnosis not present

## 2023-02-17 DIAGNOSIS — L259 Unspecified contact dermatitis, unspecified cause: Secondary | ICD-10-CM | POA: Diagnosis not present

## 2023-02-17 DIAGNOSIS — E559 Vitamin D deficiency, unspecified: Secondary | ICD-10-CM | POA: Diagnosis not present

## 2023-02-17 DIAGNOSIS — I10 Essential (primary) hypertension: Secondary | ICD-10-CM | POA: Diagnosis not present

## 2023-02-17 DIAGNOSIS — Z23 Encounter for immunization: Secondary | ICD-10-CM | POA: Diagnosis not present

## 2023-02-17 DIAGNOSIS — E1142 Type 2 diabetes mellitus with diabetic polyneuropathy: Secondary | ICD-10-CM | POA: Diagnosis not present

## 2023-02-19 DIAGNOSIS — R059 Cough, unspecified: Secondary | ICD-10-CM | POA: Diagnosis not present

## 2023-02-19 DIAGNOSIS — R0981 Nasal congestion: Secondary | ICD-10-CM | POA: Diagnosis not present

## 2023-02-19 DIAGNOSIS — Z20822 Contact with and (suspected) exposure to covid-19: Secondary | ICD-10-CM | POA: Diagnosis not present

## 2023-02-21 DIAGNOSIS — R0981 Nasal congestion: Secondary | ICD-10-CM | POA: Diagnosis not present

## 2023-02-21 DIAGNOSIS — Z20822 Contact with and (suspected) exposure to covid-19: Secondary | ICD-10-CM | POA: Diagnosis not present

## 2023-02-21 DIAGNOSIS — R059 Cough, unspecified: Secondary | ICD-10-CM | POA: Diagnosis not present

## 2023-04-04 NOTE — Progress Notes (Signed)
Sent message, via epic in basket, requesting orders in epic from surgeon.  

## 2023-04-06 ENCOUNTER — Ambulatory Visit: Payer: Self-pay | Admitting: Student

## 2023-04-06 DIAGNOSIS — E119 Type 2 diabetes mellitus without complications: Secondary | ICD-10-CM

## 2023-04-11 ENCOUNTER — Encounter (HOSPITAL_COMMUNITY)
Admission: RE | Admit: 2023-04-11 | Discharge: 2023-04-11 | Disposition: A | Discharge status: Home / Self Care | Payer: Medicare HMO | Source: Ambulatory Visit | Attending: Internal Medicine | Admitting: Internal Medicine

## 2023-04-12 ENCOUNTER — Encounter (HOSPITAL_COMMUNITY): Admission: RE | Admit: 2023-04-12 | Payer: Medicare HMO | Source: Ambulatory Visit

## 2023-04-14 ENCOUNTER — Other Ambulatory Visit (HOSPITAL_COMMUNITY): Payer: Medicare HMO

## 2023-04-14 NOTE — Patient Instructions (Addendum)
DUE TO COVID-19 ONLY TWO VISITORS  (aged 70 and older)  ARE ALLOWED TO COME WITH YOU AND STAY IN THE WAITING ROOM ONLY DURING PRE OP AND PROCEDURE.   **NO VISITORS ARE ALLOWED IN THE SHORT STAY AREA OR RECOVERY ROOM!!**  IF YOU WILL BE ADMITTED INTO THE HOSPITAL YOU ARE ALLOWED ONLY FOUR SUPPORT PEOPLE DURING VISITATION HOURS ONLY (7 AM -8PM)   The support person(s) must pass our screening, gel in and out, and wear a mask at all times, including in the patient's room. Patients must also wear a mask when staff or their support person are in the room. Visitors GUEST BADGE MUST BE WORN VISIBLY  One adult visitor may remain with you overnight and MUST be in the room by 8 P.M.     Your procedure is scheduled on: 04/21/23   Report to [facility] Main Entrance    Report to admitting at: 7:30 AM   Call this number if you have problems the morning of surgery [phone]   Do not eat food :After Midnight.   After Midnight you may have the following liquids until : 7:00 AM DAY OF SURGERY  Water Black Coffee (sugar ok, NO MILK/CREAM OR CREAMERS)  Tea (sugar ok, NO MILK/CREAM OR CREAMERS) regular and decaf                             Plain Jell-O (NO RED)                                           Fruit ices (not with fruit pulp, NO RED)                                     Popsicles (NO RED)                                                                  Juice: apple, WHITE grape, WHITE cranberry Sports drinks like Gatorade (NO RED)   The day of surgery:  Drink ONE (1) Pre-Surgery Clear G2 at: 7:30 AM the morning of surgery. Drink in one sitting. Do not sip.  This drink was given to you during your hospital  pre-op appointment visit. Nothing else to drink after completing the  Pre-Surgery Clear Ensure or G2.          If you have questions, please contact your surgeon's office.    Oral Hygiene is also important to reduce your risk of infection.                                     Remember - BRUSH YOUR TEETH THE MORNING OF SURGERY WITH YOUR REGULAR TOOTHPASTE  DENTURES WILL BE REMOVED PRIOR TO SURGERY PLEASE DO NOT APPLY "Poly grip" OR ADHESIVES!!!   Do NOT smoke after Midnight   Take these medicines the morning of surgery with A SIP OF WATER: gabapentin,omeprazole. How to Manage Your Diabetes Before and After  Surgery  Why is it important to control my blood sugar before and after surgery? Improving blood sugar levels before and after surgery helps healing and can limit problems. A way of improving blood sugar control is eating a healthy diet by:  Eating less sugar and carbohydrates  Increasing activity/exercise  Talking with your doctor about reaching your blood sugar goals High blood sugars (greater than 180 mg/dL) can raise your risk of infections and slow your recovery, so you will need to focus on controlling your diabetes during the weeks before surgery. Make sure that the doctor who takes care of your diabetes knows about your planned surgery including the date and location.  How do I manage my blood sugar before surgery? Check your blood sugar at least 4 times a day, starting 2 days before surgery, to make sure that the level is not too high or low. Check your blood sugar the morning of your surgery when you wake up and every 2 hours until you get to the Short Stay unit. If your blood sugar is less than 70 mg/dL, you will need to treat for low blood sugar: Do not take insulin. Treat a low blood sugar (less than 70 mg/dL) with  cup of clear juice (cranberry or apple), 4 glucose tablets, OR glucose gel. Recheck blood sugar in 15 minutes after treatment (to make sure it is greater than 70 mg/dL). If your blood sugar is not greater than 70 mg/dL on recheck, call [phone] for further instructions. Report your blood sugar to the short stay nurse when you get to Short Stay.  If you are admitted to the hospital after surgery: Your blood sugar will be  checked by the staff and you will probably be given insulin after surgery (instead of oral diabetes medicines) to make sure you have good blood sugar levels. The goal for blood sugar control after surgery is 80-180 mg/dL.   WHAT DO I DO ABOUT MY DIABETES MEDICATION?  Hold jardiance 72 hours ( 3 days) before surgery.: Last dose: 04/17/23  THE DAY BEFORE SURGERY, take metformin as usual.      THE MORNING OF SURGERY,DO NOT TAKE ANY ORAL DIABETIC MEDICATIONS DAY OF YOUR SURGERY  DO NOT TAKE THE FOLLOWING 7 DAYS PRIOR TO SURGERY: Ozempic, Wegovy, Rybelsus (Semaglutide), Byetta (exenatide), Bydureon (exenatide ER), Victoza, Saxenda (liraglutide), or Trulicity (dulaglutide) Mounjaro (Tirzepatide) Adlyxin (Lixisenatide), Polyethylene Glycol Loxenatide. Last dose: 04/08/23.                              You may not have any metal on your body including hair pins, jewelry, and body piercing             Do not wear make-up, lotions, powders, perfumes/cologne, or deodorant  Do not wear nail polish including gel and S&S, artificial/acrylic nails, or any other type of covering on natural nails including finger and toenails. If you have artificial nails, gel coating, etc. that needs to be removed by a nail salon please have this removed prior to surgery or surgery may need to be canceled/ delayed if the surgeon/ anesthesia feels like they are unable to be safely monitored.   Do not shave  48 hours prior to surgery.    Do not bring valuables to the hospital. [facility] IS NOT             RESPONSIBLE   FOR VALUABLES.   Contacts, glasses, or bridgework may not be  worn into surgery.   Bring small overnight bag day of surgery.   DO NOT BRING YOUR HOME MEDICATIONS TO THE HOSPITAL. PHARMACY WILL DISPENSE MEDICATIONS LISTED ON YOUR MEDICATION LIST TO YOU DURING YOUR ADMISSION IN THE HOSPITAL!    Patients discharged on the day of surgery will not be allowed to drive home.  Someone NEEDS to stay with you for the  first 24 hours after anesthesia.   Special Instructions: Bring a copy of your healthcare power of attorney and living will documents         the day of surgery if you haven't scanned them before.              Please read over the following fact sheets you were given: IF YOU HAVE QUESTIONS ABOUT YOUR PRE-OP INSTRUCTIONS PLEASE CALL [phone]      Incentive Spirometer  An incentive spirometer is a tool that can help keep your lungs clear and active. This tool measures how well you are filling your lungs with each breath. Taking long deep breaths may help reverse or decrease the chance of developing breathing (pulmonary) problems (especially infection) following: A long period of time when you are unable to move or be active. BEFORE THE PROCEDURE  If the spirometer includes an indicator to show your best effort, your nurse or respiratory therapist will set it to a desired goal. If possible, sit up straight or lean slightly forward. Try not to slouch. Hold the incentive spirometer in an upright position. INSTRUCTIONS FOR USE  Sit on the edge of your bed if possible, or sit up as far as you can in bed or on a chair. Hold the incentive spirometer in an upright position. Breathe out normally. Place the mouthpiece in your mouth and seal your lips tightly around it. Breathe in slowly and as deeply as possible, raising the piston or the ball toward the top of the column. Hold your breath for 3-5 seconds or for as long as possible. Allow the piston or ball to fall to the bottom of the column. Remove the mouthpiece from your mouth and breathe out normally. Rest for a few seconds and repeat Steps 1 through 7 at least 10 times every 1-2 hours when you are awake. Take your time and take a few normal breaths between deep breaths. The spirometer may include an indicator to show your best effort. Use the indicator as a goal to work toward during each repetition. After each set of 10 deep breaths, practice  coughing to be sure your lungs are clear. If you have an incision (the cut made at the time of surgery), support your incision when coughing by placing a pillow or rolled up towels firmly against it. Once you are able to get out of bed, walk around indoors and cough well. You may stop using the incentive spirometer when instructed by your caregiver.  RISKS AND COMPLICATIONS Take your time so you do not get dizzy or light-headed. If you are in pain, you may need to take or ask for pain medication before doing incentive spirometry. It is harder to take a deep breath if you are having pain. AFTER USE Rest and breathe slowly and easily. It can be helpful to keep track of a log of your progress. Your caregiver can provide you with a simple table to help with this. If you are using the spirometer at home, follow these instructions: SEEK MEDICAL CARE IF:  You are having difficultly using the spirometer. You have  trouble using the spirometer as often as instructed. Your pain medication is not giving enough relief while using the spirometer. You develop fever of 100.5 F (38.1 C) or higher. SEEK IMMEDIATE MEDICAL CARE IF:  You cough up bloody sputum that had not been present before. You develop fever of 102 F (38.9 C) or greater. You develop worsening pain at or near the incision site. MAKE SURE YOU:  Understand these instructions. Will watch your condition. Will get help right away if you are not doing well or get worse. Document Released: 04/04/2007 Document Revised: 02/14/2012 Document Reviewed: 06/05/2007 [facility] Patient Information 2014 [place], [place].   ________________________________________________________________________

## 2023-04-18 ENCOUNTER — Encounter (HOSPITAL_COMMUNITY): Payer: Self-pay

## 2023-04-18 ENCOUNTER — Encounter (HOSPITAL_COMMUNITY)
Admission: RE | Admit: 2023-04-18 | Discharge: 2023-04-18 | Disposition: A | Discharge status: Home / Self Care | Payer: Medicare HMO | Source: Ambulatory Visit | Attending: Orthopedic Surgery | Admitting: Orthopedic Surgery

## 2023-04-18 ENCOUNTER — Other Ambulatory Visit: Payer: Self-pay

## 2023-04-18 VITALS — BP 141/68 | HR 106 | Temp 98.2°F | Ht 68.0 in | Wt 242.0 lb

## 2023-04-18 DIAGNOSIS — Z01812 Encounter for preprocedural laboratory examination: Secondary | ICD-10-CM | POA: Diagnosis not present

## 2023-04-18 DIAGNOSIS — E119 Type 2 diabetes mellitus without complications: Secondary | ICD-10-CM | POA: Insufficient documentation

## 2023-04-18 DIAGNOSIS — Z794 Long term (current) use of insulin: Secondary | ICD-10-CM | POA: Diagnosis not present

## 2023-04-18 DIAGNOSIS — Z01818 Encounter for other preprocedural examination: Secondary | ICD-10-CM

## 2023-04-18 LAB — GLUCOSE, CAPILLARY: Glucose-Capillary: 199 mg/dL — ABNORMAL HIGH (ref 70–99)

## 2023-04-18 LAB — BASIC METABOLIC PANEL
Anion gap: 10 (ref 5–15)
BUN: 19 mg/dL (ref 8–23)
CO2: 24 mmol/L (ref 22–32)
Calcium: 8.9 mg/dL (ref 8.9–10.3)
Chloride: 102 mmol/L (ref 98–111)
Creatinine, Ser: 0.9 mg/dL (ref 0.44–1.00)
GFR, Estimated: >60 mL/min (ref >60)
Glucose, Bld: 176 mg/dL — ABNORMAL HIGH (ref 70–99)
Potassium: 3.4 mmol/L — ABNORMAL LOW (ref 3.5–5.1)
Sodium: 136 mmol/L (ref 135–145)

## 2023-04-18 LAB — TYPE AND SCREEN
ABO/RH(D): AB POS
Antibody Screen: NEGATIVE

## 2023-04-18 LAB — SURGICAL PCR SCREEN
MRSA, PCR: NEGATIVE
Staphylococcus aureus: NEGATIVE

## 2023-04-18 LAB — CBC
HCT: 41.9 % (ref 36.0–46.0)
Hemoglobin: 12.6 g/dL (ref 12.0–15.0)
MCH: 23.4 pg — ABNORMAL LOW (ref 26.0–34.0)
MCHC: 30.1 g/dL (ref 30.0–36.0)
MCV: 77.7 fL — ABNORMAL LOW (ref 80.0–100.0)
Platelets: 300 10*3/uL (ref 150–400)
RBC: 5.39 MIL/uL — ABNORMAL HIGH (ref 3.87–5.11)
RDW: 21.4 % — ABNORMAL HIGH (ref 11.5–15.5)
WBC: 8.4 10*3/uL (ref 4.0–10.5)
nRBC: 0 % (ref 0.0–0.2)

## 2023-04-18 LAB — HEMOGLOBIN A1C
Hgb A1c MFr Bld: 7 % — ABNORMAL HIGH (ref 4.8–5.6)
Mean Plasma Glucose: 154.2 mg/dL

## 2023-04-18 NOTE — Progress Notes (Addendum)
For Short Stay: COVID SWAB appointment date:  Bowel Prep reminder:   For Anesthesia: PCP - Dr. Fleet Contras : clearance: 02/17/23: Chart. Cardiologist - N/A  Chest x-ray -  EKG - 10/11/22 Stress Test -  ECHO -  Cardiac Cath -  Pacemaker/ICD device last checked: Pacemaker orders received: Device Rep notified:  Spinal Cord Stimulator: N/A  Sleep Study -  CPAP -   Fasting Blood Sugar - 180's Checks Blood Sugar ____1_ times a day Date and result of last Hgb A1c-  Last dose of GLP1 agonist- Dulaglutide on hold since 04/08/23 GLP1 instructions: To keep on hold  Last dose of SGLT-2 inhibitors-  SGLT-2 instructions:   Blood Thinner Instructions: Aspirin Instructions: Last Dose:  Activity level: Can go up a flight of stairs and activities of daily living without stopping and without chest pain and/or shortness of breath   Able to exercise without chest pain and/or shortness of breath    Anesthesia review: Hx: HTN,DIA,Rt. BBB.  Patient denies shortness of breath, fever, cough and chest pain at PAT appointment   Patient verbalized understanding of instructions that were given to them at the PAT appointment. Patient was also instructed that they will need to review over the PAT instructions again at home before surgery.

## 2023-04-20 ENCOUNTER — Ambulatory Visit: Payer: Self-pay | Admitting: Student

## 2023-04-20 NOTE — H&P (View-Only) (Signed)
TOTAL HIP ADMISSION H&P  Patient is admitted for right total hip arthroplasty.  Subjective:  Chief Complaint: right hip pain  HPI: Saja F Mccahill, 72 y.o. female, has a history of pain and functional disability in the right hip(s) due to arthritis and patient has failed non-surgical conservative treatments for greater than 12 weeks to include NSAID's and/or analgesics, flexibility and strengthening excercises, use of assistive devices, and activity modification.  Onset of symptoms was gradual starting 10 years ago with rapidlly worsening course since that time.The patient noted no past surgery on the right hip(s).  Patient currently rates pain in the right hip at 10 out of 10 with activity. Patient has night pain, worsening of pain with activity and weight bearing, trendelenberg gait, pain that interfers with activities of daily living, and pain with passive range of motion. Patient has evidence of subchondral cysts, subchondral sclerosis, periarticular osteophytes, and joint space narrowing by imaging studies. This condition presents safety issues increasing the risk of falls.  There is no current active infection.  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   Primary osteoarthritis of left knee 02/16/2019   Osteoarthritis of left knee 02/14/2019   Left knee DJD 02/14/2019   Osteoarthritis of left hip 04/06/2018   Past Medical History:  Diagnosis Date   Abnormal uterine bleeding (AUB)    abnormal endometrial biopsy  10/ 2023   Asthma    not current   Family history of adverse reaction to anesthesia    sister-- ponv   GERD (gastroesophageal reflux disease)    severe    Hypertension    OA (osteoarthritis)    hips, knees   RBBB (right bundle branch block)    Type 2 diabetes mellitus (HCC)    followed by pcp  (10-06-2022  per pt check blood sugar daily in am fasting,  average 130--170)   Wears glasses    Wears glasses     Past Surgical History:   Procedure Laterality Date   DILATION AND CURETTAGE OF UTERUS     HYSTEROSCOPY WITH D & C N/A 10/11/2022   Procedure: DILATATION AND CURETTAGE /HYSTEROSCOPY;  Surgeon: Ogunbekun, Eboni D, MD;  Location: [facility];  Service: Gynecology;  Laterality: N/A;   INTRAUTERINE DEVICE INSERTION  10/25/2022   TOTAL HIP ARTHROPLASTY Left 04/06/2018   Procedure: LEFT TOTAL HIP ARTHROPLASTY ANTERIOR APPROACH;  Surgeon: Swinteck, Brian, MD;  Location: [facility];  Service: Orthopedics;  Laterality: Left;  Needs RNFA   TOTAL KNEE ARTHROPLASTY Left 02/14/2019   Procedure: TOTAL KNEE ARTHROPLASTY;  Surgeon: Beane, Jeffrey, MD;  Location: [facility];  Service: Orthopedics;  Laterality: Left;  150 mins   TOTAL KNEE ARTHROPLASTY Right 07/22/2021   Procedure: TOTAL KNEE ARTHROPLASTY;  Surgeon: Beane, Jeffrey, MD;  Location: [facility];  Service: Orthopedics;  Laterality: Right;    Current Outpatient Medications  Medication Sig Dispense Refill Last Dose   albuterol (PROVENTIL HFA;VENTOLIN HFA) 108 (90 Base) MCG/ACT inhaler Inhale 2 puffs into the lungs every 6 (six) hours as needed for wheezing or shortness of breath.      atorvastatin (LIPITOR) 10 MG tablet Take 10 mg by mouth at bedtime.      celecoxib (CELEBREX) 200 MG capsule Take 200 mg by mouth daily.       Dulaglutide (TRULICITY) 3 MG/0.5ML SOPN Inject 3 mg into the skin once a week. Monday's      empagliflozin (JARDIANCE) 25 MG TABS tablet Take 25 mg   by mouth daily.      Ergocalciferol (VITAMIN D2) 50 MCG (2000 UT) TABS Take 2,000 Units by mouth daily.      fluticasone (FLONASE) 50 MCG/ACT nasal spray Place 2 sprays into both nostrils daily as needed for allergies.      furosemide (LASIX) 20 MG tablet Take 20 mg by mouth daily as needed for fluid.      gabapentin (NEURONTIN) 100 MG capsule Take 100 mg by mouth 3 (three) times daily.      hydrochlorothiazide (HYDRODIURIL) 25 MG tablet Take 25 mg by mouth daily.      loratadine (CLARITIN) 10 MG tablet  Take 10 mg by mouth daily as needed for allergies.      losartan (COZAAR) 100 MG tablet Take 100 mg by mouth daily.      metFORMIN (GLUCOPHAGE) 500 MG tablet Take 500 mg by mouth 2 (two) times daily.      omeprazole (PRILOSEC) 40 MG capsule Take 40 mg by mouth daily.      potassium chloride (KLOR-CON) 10 MEQ tablet Take 10 mEq by mouth daily.      triamcinolone cream (KENALOG) 0.5 % Apply 1 application topically 2 (two) times daily as needed for rash.      No current facility-administered medications for this visit.   No Known Allergies  Social History   Tobacco Use   Smoking status: Former    Years: 3    Types: Cigarettes    Quit date: 2000    Years since quitting: 24.3   Smokeless tobacco: Never  Substance Use Topics   Alcohol use: Not Currently    Family History  Problem Relation Age of Onset   Breast cancer Mother    Breast cancer Sister      Review of Systems  Musculoskeletal:  Positive for arthralgias and gait problem.  All other systems reviewed and are negative.   Objective:  Physical Exam Constitutional:      Appearance: Normal appearance.  HENT:     Head: Normocephalic and atraumatic.     Nose: Nose normal.     Mouth/Throat:     Mouth: Mucous membranes are moist.     Pharynx: Oropharynx is clear.  Eyes:     Conjunctiva/sclera: Conjunctivae normal.  Cardiovascular:     Rate and Rhythm: Normal rate and regular rhythm.     Pulses: Normal pulses.     Heart sounds: Normal heart sounds.  Pulmonary:     Effort: Pulmonary effort is normal.     Breath sounds: Normal breath sounds.  Abdominal:     General: Abdomen is flat.     Palpations: Abdomen is soft.  Genitourinary:    Comments: Deferred  Musculoskeletal:     Cervical back: Normal range of motion and neck supple.     Comments: Examination of the right hip reveals no skin wounds or lesions. Mild trochanteric tenderness to palpation. She has severely restricted range of motion of the right hip. Pain  with terminal flexion rotation. Pain in the position of impingement. Positive Stinchfield.  Examination left hip reveals a healed incision. Painless range of motion of the hip.  Distally, there is no focal motor or sensory deficit. She has palpable pedal pulses.  Skin:    General: Skin is warm and dry.     Capillary Refill: Capillary refill takes less than 2 seconds.  Neurological:     General: No focal deficit present.     Mental Status: She is alert and oriented to   person, place, and time.  Psychiatric:        Mood and Affect: Mood normal.        Behavior: Behavior normal.        Thought Content: Thought content normal.        Judgment: Judgment normal.     Vital signs in last 24 hours: @VSRANGES@  Labs:   Estimated body mass index is 36.8 kg/m as calculated from the following:   Height as of 04/18/23: 5' 8" (1.727 m).   Weight as of 04/18/23: 109.8 kg.   Imaging Review Plain radiographs demonstrate severe degenerative joint disease of the right hip(s). The bone quality appears to be adequate for age and reported activity level.      Assessment/Plan:  End stage arthritis, right hip(s)  The patient history, physical examination, clinical judgement of the provider and imaging studies are consistent with end stage degenerative joint disease of the right hip(s) and total hip arthroplasty is deemed medically necessary. The treatment options including medical management, injection therapy, arthroscopy and arthroplasty were discussed at length. The risks and benefits of total hip arthroplasty were presented and reviewed. The risks due to aseptic loosening, infection, stiffness, dislocation/subluxation,  thromboembolic complications and other imponderables were discussed.  The patient acknowledged the explanation, agreed to proceed with the plan and consent was signed. Patient is being admitted for inpatient treatment for surgery, pain control, PT, OT, prophylactic antibiotics, VTE  prophylaxis, progressive ambulation and ADL's and discharge planning.The patient is planning to be discharged home with HEP after an overnight stay.   Therapy Plans: HEP.  Disposition: Home with son Planned DVT Prophylaxis: aspirin 81mg BID DME needed: walker PCP: Cleared.  TXA: IV Allergies: NDKA. Anesthesia Concerns: None. BMI: 37.7 Last HgbA1c: 6.7 Other: - T2DM, insulin and metformin.  - Asthma.  - History of left THA 2019. - Aspirin 81mg baseline.  - Hydrocodone, zofran, has celebrex.  - 04/18/23: Hgb 12.6, K+ 3.4, Cr. 0.90.  A1c 7.0.      Patient's anticipated LOS is less than 2 midnights, meeting these requirements: - Younger than 65 - Lives within 1 hour of care - Has a competent adult at home to recover with post-op recover - NO history of  - Chronic pain requiring opiods  - Diabetes  - Coronary Artery Disease  - Heart failure  - Heart attack  - Stroke  - DVT/VTE  - Cardiac arrhythmia  - Respiratory Failure/COPD  - Renal failure  - Anemia  - Advanced Liver disease   

## 2023-04-20 NOTE — Anesthesia Preprocedure Evaluation (Signed)
Anesthesia Evaluation  Patient identified by MRN, date of birth, ID band Patient awake    Reviewed: Allergy & Precautions, H&P , NPO status , Patient's Chart, lab work & pertinent test results  History of Anesthesia Complications (+) Family history of anesthesia reaction  Airway Mallampati: II  TM Distance: <3 FB Neck ROM: Full    Dental no notable dental hx. (+) Teeth Intact, Dental Advisory Given, Poor Dentition,    Pulmonary asthma , former smoker   Pulmonary exam normal breath sounds clear to auscultation       Cardiovascular hypertension, Pt. on medications Normal cardiovascular exam+ dysrhythmias  Rhythm:Regular Rate:Normal     Neuro/Psych negative neurological ROS  negative psych ROS   GI/Hepatic Neg liver ROS,GERD  Medicated,,  Endo/Other  diabetes, Type 2    Renal/GU negative Renal ROS  negative genitourinary   Musculoskeletal negative musculoskeletal ROS (+) Arthritis ,    Abdominal   Peds negative pediatric ROS (+)  Hematology negative hematology ROS (+)   Anesthesia Other Findings   Reproductive/Obstetrics negative OB ROS                             Anesthesia Physical Anesthesia Plan  ASA: 3  Anesthesia Plan: MAC and Spinal   Post-op Pain Management: Minimal or no pain anticipated   Induction: Intravenous  PONV Risk Score and Plan: 3 and Ondansetron, Dexamethasone and Treatment may vary due to age or medical condition  Airway Management Planned: Natural Airway and Simple Face Mask  Additional Equipment: None  Intra-op Plan:   Post-operative Plan: Extubation in OR  Informed Consent: I have reviewed the patients History and Physical, chart, labs and discussed the procedure including the risks, benefits and alternatives for the proposed anesthesia with the patient or authorized representative who has indicated his/her understanding and acceptance.     Dental  advisory given  Plan Discussed with: CRNA and Anesthesiologist  Anesthesia Plan Comments: (  )       Anesthesia Quick Evaluation

## 2023-04-20 NOTE — H&P (Signed)
TOTAL HIP ADMISSION H&P  Patient is admitted for right total hip arthroplasty.  Subjective:  Chief Complaint: right hip pain  HPI: Margaret Cisneros, 72 y.o. female, has a history of pain and functional disability in the right hip(s) due to arthritis and patient has failed non-surgical conservative treatments for greater than 12 weeks to include NSAID's and/or analgesics, flexibility and strengthening excercises, use of assistive devices, and activity modification.  Onset of symptoms was gradual starting 10 years ago with rapidlly worsening course since that time.The patient noted no past surgery on the right hip(s).  Patient currently rates pain in the right hip at 10 out of 10 with activity. Patient has night pain, worsening of pain with activity and weight bearing, trendelenberg gait, pain that interfers with activities of daily living, and pain with passive range of motion. Patient has evidence of subchondral cysts, subchondral sclerosis, periarticular osteophytes, and joint space narrowing by imaging studies. This condition presents safety issues increasing the risk of falls.  There is no current active infection.  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   Primary osteoarthritis of left knee 02/16/2019   Osteoarthritis of left knee 02/14/2019   Left knee DJD 02/14/2019   Osteoarthritis of left hip 04/06/2018   Past Medical History:  Diagnosis Date   Abnormal uterine bleeding (AUB)    abnormal endometrial biopsy  10/ 2023   Asthma    not current   Family history of adverse reaction to anesthesia    sister-- ponv   GERD (gastroesophageal reflux disease)    severe    Hypertension    OA (osteoarthritis)    hips, knees   RBBB (right bundle branch block)    Type 2 diabetes mellitus (HCC)    followed by pcp  (10-06-2022  per pt check blood sugar daily in am fasting,  average 130--170)   Wears glasses    Wears glasses     Past Surgical History:   Procedure Laterality Date   DILATION AND CURETTAGE OF UTERUS     HYSTEROSCOPY WITH D & C N/A 10/11/2022   Procedure: DILATATION AND CURETTAGE /HYSTEROSCOPY;  Surgeon: Jackie Plum, MD;  Location: [facility];  Service: Gynecology;  Laterality: N/A;   INTRAUTERINE DEVICE INSERTION  10/25/2022   TOTAL HIP ARTHROPLASTY Left 04/06/2018   Procedure: LEFT TOTAL HIP ARTHROPLASTY ANTERIOR APPROACH;  Surgeon: Samson Frederic, MD;  Location: [facility];  Service: Orthopedics;  Laterality: Left;  Needs RNFA   TOTAL KNEE ARTHROPLASTY Left 02/14/2019   Procedure: TOTAL KNEE ARTHROPLASTY;  Surgeon: Jene Every, MD;  Location: [facility];  Service: Orthopedics;  Laterality: Left;  150 mins   TOTAL KNEE ARTHROPLASTY Right 07/22/2021   Procedure: TOTAL KNEE ARTHROPLASTY;  Surgeon: Jene Every, MD;  Location: [facility];  Service: Orthopedics;  Laterality: Right;    Current Outpatient Medications  Medication Sig Dispense Refill Last Dose   albuterol (PROVENTIL HFA;VENTOLIN HFA) 108 (90 Base) MCG/ACT inhaler Inhale 2 puffs into the lungs every 6 (six) hours as needed for wheezing or shortness of breath.      atorvastatin (LIPITOR) 10 MG tablet Take 10 mg by mouth at bedtime.      celecoxib (CELEBREX) 200 MG capsule Take 200 mg by mouth daily.       Dulaglutide (TRULICITY) 3 MG/0.5ML SOPN Inject 3 mg into the skin once a week. Monday's      empagliflozin (JARDIANCE) 25 MG TABS tablet Take 25 mg  by mouth daily.      Ergocalciferol (VITAMIN D2) 50 MCG (2000 UT) TABS Take 2,000 Units by mouth daily.      fluticasone (FLONASE) 50 MCG/ACT nasal spray Place 2 sprays into both nostrils daily as needed for allergies.      furosemide (LASIX) 20 MG tablet Take 20 mg by mouth daily as needed for fluid.      gabapentin (NEURONTIN) 100 MG capsule Take 100 mg by mouth 3 (three) times daily.      hydrochlorothiazide (HYDRODIURIL) 25 MG tablet Take 25 mg by mouth daily.      loratadine (CLARITIN) 10 MG tablet  Take 10 mg by mouth daily as needed for allergies.      losartan (COZAAR) 100 MG tablet Take 100 mg by mouth daily.      metFORMIN (GLUCOPHAGE) 500 MG tablet Take 500 mg by mouth 2 (two) times daily.      omeprazole (PRILOSEC) 40 MG capsule Take 40 mg by mouth daily.      potassium chloride (KLOR-CON) 10 MEQ tablet Take 10 mEq by mouth daily.      triamcinolone cream (KENALOG) 0.5 % Apply 1 application topically 2 (two) times daily as needed for rash.      No current facility-administered medications for this visit.   No Known Allergies  Social History   Tobacco Use   Smoking status: Former    Years: 3    Types: Cigarettes    Quit date: 2000    Years since quitting: 24.3   Smokeless tobacco: Never  Substance Use Topics   Alcohol use: Not Currently    Family History  Problem Relation Age of Onset   Breast cancer Mother    Breast cancer Sister      Review of Systems  Musculoskeletal:  Positive for arthralgias and gait problem.  All other systems reviewed and are negative.   Objective:  Physical Exam Constitutional:      Appearance: Normal appearance.  HENT:     Head: Normocephalic and atraumatic.     Nose: Nose normal.     Mouth/Throat:     Mouth: Mucous membranes are moist.     Pharynx: Oropharynx is clear.  Eyes:     Conjunctiva/sclera: Conjunctivae normal.  Cardiovascular:     Rate and Rhythm: Normal rate and regular rhythm.     Pulses: Normal pulses.     Heart sounds: Normal heart sounds.  Pulmonary:     Effort: Pulmonary effort is normal.     Breath sounds: Normal breath sounds.  Abdominal:     General: Abdomen is flat.     Palpations: Abdomen is soft.  Genitourinary:    Comments: Deferred  Musculoskeletal:     Cervical back: Normal range of motion and neck supple.     Comments: Examination of the right hip reveals no skin wounds or lesions. Mild trochanteric tenderness to palpation. She has severely restricted range of motion of the right hip. Pain  with terminal flexion rotation. Pain in the position of impingement. Positive Stinchfield.  Examination left hip reveals a healed incision. Painless range of motion of the hip.  Distally, there is no focal motor or sensory deficit. She has palpable pedal pulses.  Skin:    General: Skin is warm and dry.     Capillary Refill: Capillary refill takes less than 2 seconds.  Neurological:     General: No focal deficit present.     Mental Status: She is alert and oriented to  person, place, and time.  Psychiatric:        Mood and Affect: Mood normal.        Behavior: Behavior normal.        Thought Content: Thought content normal.        Judgment: Judgment normal.     Vital signs in last 24 hours: @VSRANGES @  Labs:   Estimated body mass index is 36.8 kg/m as calculated from the following:   Height as of 04/18/23: 5\' 8"  (1.727 m).   Weight as of 04/18/23: 109.8 kg.   Imaging Review Plain radiographs demonstrate severe degenerative joint disease of the right hip(s). The bone quality appears to be adequate for age and reported activity level.      Assessment/Plan:  End stage arthritis, right hip(s)  The patient history, physical examination, clinical judgement of the provider and imaging studies are consistent with end stage degenerative joint disease of the right hip(s) and total hip arthroplasty is deemed medically necessary. The treatment options including medical management, injection therapy, arthroscopy and arthroplasty were discussed at length. The risks and benefits of total hip arthroplasty were presented and reviewed. The risks due to aseptic loosening, infection, stiffness, dislocation/subluxation,  thromboembolic complications and other imponderables were discussed.  The patient acknowledged the explanation, agreed to proceed with the plan and consent was signed. Patient is being admitted for inpatient treatment for surgery, pain control, PT, OT, prophylactic antibiotics, VTE  prophylaxis, progressive ambulation and ADL's and discharge planning.The patient is planning to be discharged home with HEP after an overnight stay.   Therapy Plans: HEP.  Disposition: Home with son Planned DVT Prophylaxis: aspirin 81mg  BID DME needed: walker PCP: Cleared.  TXA: IV Allergies: NDKA. Anesthesia Concerns: None. BMI: 37.7 Last HgbA1c: 6.7 Other: - T2DM, insulin and metformin.  - Asthma.  - History of left THA 2019. - Aspirin 81mg  baseline.  - Hydrocodone, zofran, has celebrex.  - 04/18/23: Hgb 12.6, K+ 3.4, Cr. 0.90.  A1c 7.0.      Patient's anticipated LOS is less than 2 midnights, meeting these requirements: - Younger than 33 - Lives within 1 hour of care - Has a competent adult at home to recover with post-op recover - NO history of  - Chronic pain requiring opiods  - Diabetes  - Coronary Artery Disease  - Heart failure  - Heart attack  - Stroke  - DVT/VTE  - Cardiac arrhythmia  - Respiratory Failure/COPD  - Renal failure  - Anemia  - Advanced Liver disease

## 2023-04-21 ENCOUNTER — Ambulatory Visit (HOSPITAL_COMMUNITY): Payer: Medicare HMO | Admitting: Physician Assistant

## 2023-04-21 ENCOUNTER — Encounter (HOSPITAL_COMMUNITY): Payer: Self-pay | Admitting: Orthopedic Surgery

## 2023-04-21 ENCOUNTER — Ambulatory Visit (HOSPITAL_COMMUNITY): Payer: Medicare HMO

## 2023-04-21 ENCOUNTER — Other Ambulatory Visit: Payer: Self-pay

## 2023-04-21 ENCOUNTER — Ambulatory Visit (HOSPITAL_BASED_OUTPATIENT_CLINIC_OR_DEPARTMENT_OTHER): Payer: Medicare HMO | Admitting: Anesthesiology

## 2023-04-21 ENCOUNTER — Ambulatory Visit (HOSPITAL_COMMUNITY)
Admission: RE | Admit: 2023-04-21 | Discharge: 2023-04-21 | Disposition: A | Discharge status: Home / Self Care | Payer: Medicare HMO | Attending: Orthopedic Surgery | Admitting: Orthopedic Surgery

## 2023-04-21 ENCOUNTER — Encounter (HOSPITAL_COMMUNITY)
Admission: RE | Disposition: A | Discharge status: Home / Self Care | Payer: Self-pay | Source: Home / Self Care | Attending: Orthopedic Surgery

## 2023-04-21 DIAGNOSIS — M1611 Unilateral primary osteoarthritis, right hip: Secondary | ICD-10-CM | POA: Diagnosis not present

## 2023-04-21 DIAGNOSIS — Z7985 Long-term (current) use of injectable non-insulin antidiabetic drugs: Secondary | ICD-10-CM | POA: Diagnosis not present

## 2023-04-21 DIAGNOSIS — I10 Essential (primary) hypertension: Secondary | ICD-10-CM | POA: Insufficient documentation

## 2023-04-21 DIAGNOSIS — K219 Gastro-esophageal reflux disease without esophagitis: Secondary | ICD-10-CM | POA: Diagnosis not present

## 2023-04-21 DIAGNOSIS — M21951 Unspecified acquired deformity of right thigh: Secondary | ICD-10-CM

## 2023-04-21 DIAGNOSIS — E119 Type 2 diabetes mellitus without complications: Secondary | ICD-10-CM | POA: Diagnosis not present

## 2023-04-21 DIAGNOSIS — Z7984 Long term (current) use of oral hypoglycemic drugs: Secondary | ICD-10-CM | POA: Insufficient documentation

## 2023-04-21 DIAGNOSIS — M247 Protrusio acetabuli: Secondary | ICD-10-CM | POA: Diagnosis not present

## 2023-04-21 DIAGNOSIS — Z96642 Presence of left artificial hip joint: Secondary | ICD-10-CM | POA: Insufficient documentation

## 2023-04-21 DIAGNOSIS — Z87891 Personal history of nicotine dependence: Secondary | ICD-10-CM | POA: Diagnosis not present

## 2023-04-21 DIAGNOSIS — Z79899 Other long term (current) drug therapy: Secondary | ICD-10-CM | POA: Diagnosis not present

## 2023-04-21 DIAGNOSIS — Z471 Aftercare following joint replacement surgery: Secondary | ICD-10-CM | POA: Diagnosis not present

## 2023-04-21 DIAGNOSIS — Z96641 Presence of right artificial hip joint: Secondary | ICD-10-CM | POA: Diagnosis not present

## 2023-04-21 DIAGNOSIS — J45909 Unspecified asthma, uncomplicated: Secondary | ICD-10-CM | POA: Diagnosis not present

## 2023-04-21 HISTORY — PX: TOTAL HIP ARTHROPLASTY: SHX124

## 2023-04-21 LAB — GLUCOSE, CAPILLARY
Glucose-Capillary: 154 mg/dL — ABNORMAL HIGH (ref 70–99)
Glucose-Capillary: 123 mg/dL — ABNORMAL HIGH (ref 70–99)

## 2023-04-21 SURGERY — ARTHROPLASTY, HIP, TOTAL, ANTERIOR APPROACH
Anesthesia: Monitor Anesthesia Care | Site: Hip | Laterality: Right

## 2023-04-21 MED ORDER — CEFAZOLIN SODIUM-DEXTROSE 2-4 GM/100ML-% IV SOLN
2.0000 g | INTRAVENOUS | Status: AC
  Administered 2023-04-21: 2 g via INTRAVENOUS
  Filled 2023-04-21: qty 100

## 2023-04-21 MED ORDER — LACTATED RINGERS IV BOLUS
250.0000 mL | Freq: Once | INTRAVENOUS | Status: AC
  Administered 2023-04-21: 250 mL via INTRAVENOUS

## 2023-04-21 MED ORDER — METOCLOPRAMIDE HCL 5 MG/ML IJ SOLN: 5.0000 mg | Freq: Three times a day (TID) | INTRAMUSCULAR | Status: DC | PRN

## 2023-04-21 MED ORDER — ACETAMINOPHEN 500 MG PO TABS
1000.0000 mg | ORAL_TABLET | Freq: Once | ORAL | Status: DC
  Filled 2023-04-21: qty 2

## 2023-04-21 MED ORDER — ONDANSETRON HCL 4 MG/2ML IJ SOLN
INTRAMUSCULAR | Status: AC
  Filled 2023-04-21: qty 2

## 2023-04-21 MED ORDER — HYDROCODONE-ACETAMINOPHEN 7.5-325 MG PO TABS: 1.0000 | ORAL_TABLET | ORAL | Status: DC | PRN

## 2023-04-21 MED ORDER — POVIDONE-IODINE 10 % EX SWAB: 2.0000 | Freq: Once | CUTANEOUS | Status: DC

## 2023-04-21 MED ORDER — ALBUTEROL SULFATE (2.5 MG/3ML) 0.083% IN NEBU: 3.0000 mL | INHALATION_SOLUTION | Freq: Four times a day (QID) | RESPIRATORY_TRACT | Status: DC | PRN

## 2023-04-21 MED ORDER — CELECOXIB 200 MG PO CAPS
200.0000 mg | ORAL_CAPSULE | Freq: Once | ORAL | Status: AC
  Administered 2023-04-21: 200 mg via ORAL
  Filled 2023-04-21: qty 1

## 2023-04-21 MED ORDER — ONDANSETRON HCL 4 MG PO TABS: 4.0000 mg | ORAL_TABLET | Freq: Four times a day (QID) | ORAL | Status: DC | PRN

## 2023-04-21 MED ORDER — ACETAMINOPHEN 500 MG PO TABS
1000.0000 mg | ORAL_TABLET | Freq: Once | ORAL | Status: AC
  Administered 2023-04-21: 1000 mg via ORAL

## 2023-04-21 MED ORDER — MIDAZOLAM HCL 5 MG/5ML IJ SOLN
INTRAMUSCULAR | Status: DC | PRN
  Administered 2023-04-21: 2 mg via INTRAVENOUS

## 2023-04-21 MED ORDER — CHLORHEXIDINE GLUCONATE 0.12 % MT SOLN
15.0000 mL | Freq: Once | OROMUCOSAL | Status: AC
  Administered 2023-04-21: 15 mL via OROMUCOSAL

## 2023-04-21 MED ORDER — ONDANSETRON HCL 4 MG PO TABS: 4.0000 mg | ORAL_TABLET | Freq: Three times a day (TID) | ORAL | 0 refills | Status: AC | PRN

## 2023-04-21 MED ORDER — ISOPROPYL ALCOHOL 70 % SOLN
Status: DC | PRN
  Administered 2023-04-21: 1 via TOPICAL

## 2023-04-21 MED ORDER — OXYCODONE HCL 5 MG PO TABS
5.0000 mg | ORAL_TABLET | Freq: Once | ORAL | Status: AC | PRN
  Administered 2023-04-21: 5 mg via ORAL

## 2023-04-21 MED ORDER — ALBUMIN HUMAN 5 % IV SOLN: INTRAVENOUS | Status: DC | PRN

## 2023-04-21 MED ORDER — OXYCODONE HCL 5 MG/5ML PO SOLN: 5.0000 mg | Freq: Once | ORAL | Status: AC | PRN

## 2023-04-21 MED ORDER — PHENYLEPHRINE HCL-NACL 20-0.9 MG/250ML-% IV SOLN
INTRAVENOUS | Status: DC | PRN
  Administered 2023-04-21: 50 ug/min via INTRAVENOUS

## 2023-04-21 MED ORDER — PROPOFOL 1000 MG/100ML IV EMUL
INTRAVENOUS | Status: AC
  Filled 2023-04-21: qty 100

## 2023-04-21 MED ORDER — METHOCARBAMOL 500 MG PO TABS
ORAL_TABLET | ORAL | Status: AC
  Filled 2023-04-21: qty 1

## 2023-04-21 MED ORDER — ACETAMINOPHEN 325 MG PO TABS: 325.0000 mg | ORAL_TABLET | Freq: Four times a day (QID) | ORAL | Status: DC | PRN

## 2023-04-21 MED ORDER — BUPIVACAINE-EPINEPHRINE 0.25% -1:200000 IJ SOLN
INTRAMUSCULAR | Status: DC | PRN
  Administered 2023-04-21: 30 mL

## 2023-04-21 MED ORDER — SODIUM CHLORIDE (PF) 0.9 % IJ SOLN
INTRAMUSCULAR | Status: DC | PRN
  Administered 2023-04-21: 30 mL

## 2023-04-21 MED ORDER — DOCUSATE SODIUM 100 MG PO CAPS: 100.0000 mg | ORAL_CAPSULE | Freq: Two times a day (BID) | ORAL | 0 refills | Status: AC

## 2023-04-21 MED ORDER — SODIUM CHLORIDE (PF) 0.9 % IJ SOLN
INTRAMUSCULAR | Status: AC
  Filled 2023-04-21: qty 50

## 2023-04-21 MED ORDER — LACTATED RINGERS IV BOLUS
500.0000 mL | Freq: Once | INTRAVENOUS | Status: AC
  Administered 2023-04-21: 500 mL via INTRAVENOUS

## 2023-04-21 MED ORDER — ORAL CARE MOUTH RINSE: 15.0000 mL | Freq: Once | OROMUCOSAL | Status: AC

## 2023-04-21 MED ORDER — CEFAZOLIN SODIUM-DEXTROSE 2-4 GM/100ML-% IV SOLN: 2.0000 g | Freq: Four times a day (QID) | INTRAVENOUS | Status: DC

## 2023-04-21 MED ORDER — LIDOCAINE HCL (PF) 2 % IJ SOLN
INTRAMUSCULAR | Status: AC
  Filled 2023-04-21: qty 5

## 2023-04-21 MED ORDER — OXYCODONE HCL 5 MG PO TABS
ORAL_TABLET | ORAL | Status: AC
  Filled 2023-04-21: qty 1

## 2023-04-21 MED ORDER — ASPIRIN 81 MG PO CHEW: 81.0000 mg | CHEWABLE_TABLET | Freq: Two times a day (BID) | ORAL | 0 refills | Status: AC

## 2023-04-21 MED ORDER — FENTANYL CITRATE PF 50 MCG/ML IJ SOSY: 25.0000 ug | PREFILLED_SYRINGE | INTRAMUSCULAR | Status: DC | PRN

## 2023-04-21 MED ORDER — METOCLOPRAMIDE HCL 5 MG PO TABS: 5.0000 mg | ORAL_TABLET | Freq: Three times a day (TID) | ORAL | Status: DC | PRN

## 2023-04-21 MED ORDER — LIDOCAINE HCL (PF) 2 % IJ SOLN
INTRAMUSCULAR | Status: DC | PRN
  Administered 2023-04-21: 60 mg via INTRADERMAL

## 2023-04-21 MED ORDER — PHENYLEPHRINE HCL-NACL 20-0.9 MG/250ML-% IV SOLN
INTRAVENOUS | Status: AC
  Filled 2023-04-21: qty 250

## 2023-04-21 MED ORDER — PROPOFOL 10 MG/ML IV BOLUS
INTRAVENOUS | Status: DC | PRN
  Administered 2023-04-21: 70 mg via INTRAVENOUS

## 2023-04-21 MED ORDER — METHOCARBAMOL 500 MG PO TABS
500.0000 mg | ORAL_TABLET | Freq: Four times a day (QID) | ORAL | Status: DC | PRN
  Administered 2023-04-21: 500 mg via ORAL

## 2023-04-21 MED ORDER — GLYCOPYRROLATE 0.2 MG/ML IJ SOLN
INTRAMUSCULAR | Status: AC
  Filled 2023-04-21: qty 1

## 2023-04-21 MED ORDER — ACETAMINOPHEN 325 MG PO TABS
ORAL_TABLET | ORAL | Status: AC
  Filled 2023-04-21: qty 2

## 2023-04-21 MED ORDER — LACTATED RINGERS IV SOLN: INTRAVENOUS | Status: DC

## 2023-04-21 MED ORDER — POLYETHYLENE GLYCOL 3350 17 G PO PACK: 17.0000 g | PACK | Freq: Every day | ORAL | 0 refills | Status: AC | PRN

## 2023-04-21 MED ORDER — ACETAMINOPHEN 325 MG PO TABS
325.0000 mg | ORAL_TABLET | ORAL | Status: DC | PRN
  Administered 2023-04-21: 650 mg via ORAL

## 2023-04-21 MED ORDER — GLYCOPYRROLATE 0.2 MG/ML IJ SOLN
INTRAMUSCULAR | Status: DC | PRN
  Administered 2023-04-21: .1 mg via INTRAVENOUS

## 2023-04-21 MED ORDER — ONDANSETRON HCL 4 MG/2ML IJ SOLN
INTRAMUSCULAR | Status: DC | PRN
  Administered 2023-04-21: 4 mg via INTRAVENOUS

## 2023-04-21 MED ORDER — KETOROLAC TROMETHAMINE 30 MG/ML IJ SOLN
INTRAMUSCULAR | Status: AC
  Filled 2023-04-21: qty 1

## 2023-04-21 MED ORDER — METHOCARBAMOL 500 MG IVPB - SIMPLE MED: 500.0000 mg | Freq: Four times a day (QID) | INTRAVENOUS | Status: DC | PRN

## 2023-04-21 MED ORDER — EPINEPHRINE PF 1 MG/ML IJ SOLN
INTRAMUSCULAR | Status: AC
  Filled 2023-04-21: qty 1

## 2023-04-21 MED ORDER — MEPERIDINE HCL 50 MG/ML IJ SOLN: 6.2500 mg | INTRAMUSCULAR | Status: DC | PRN

## 2023-04-21 MED ORDER — BUPIVACAINE HCL (PF) 0.25 % IJ SOLN
INTRAMUSCULAR | Status: AC
  Filled 2023-04-21: qty 30

## 2023-04-21 MED ORDER — SENNA 8.6 MG PO TABS: 2.0000 | ORAL_TABLET | Freq: Every day | ORAL | 0 refills | Status: AC

## 2023-04-21 MED ORDER — ONDANSETRON HCL 4 MG/2ML IJ SOLN: 4.0000 mg | Freq: Four times a day (QID) | INTRAMUSCULAR | Status: DC | PRN

## 2023-04-21 MED ORDER — HYDROCODONE-ACETAMINOPHEN 5-325 MG PO TABS: 1.0000 | ORAL_TABLET | ORAL | Status: DC | PRN

## 2023-04-21 MED ORDER — MIDAZOLAM HCL 2 MG/2ML IJ SOLN
INTRAMUSCULAR | Status: AC
  Filled 2023-04-21: qty 2

## 2023-04-21 MED ORDER — ONDANSETRON HCL 4 MG/2ML IJ SOLN: 4.0000 mg | Freq: Once | INTRAMUSCULAR | Status: DC | PRN

## 2023-04-21 MED ORDER — DEXAMETHASONE SODIUM PHOSPHATE 10 MG/ML IJ SOLN
INTRAMUSCULAR | Status: AC
  Filled 2023-04-21: qty 1

## 2023-04-21 MED ORDER — SODIUM CHLORIDE 0.9 % IR SOLN
Status: DC | PRN
  Administered 2023-04-21: 3000 mL

## 2023-04-21 MED ORDER — DEXAMETHASONE SODIUM PHOSPHATE 10 MG/ML IJ SOLN
INTRAMUSCULAR | Status: DC | PRN
  Administered 2023-04-21: 4 mg via INTRAVENOUS

## 2023-04-21 MED ORDER — ALBUMIN HUMAN 5 % IV SOLN
INTRAVENOUS | Status: AC
  Filled 2023-04-21: qty 250

## 2023-04-21 MED ORDER — PROPOFOL 500 MG/50ML IV EMUL
INTRAVENOUS | Status: DC | PRN
  Administered 2023-04-21: 80 ug/kg/min via INTRAVENOUS

## 2023-04-21 MED ORDER — CELECOXIB 200 MG PO CAPS: 200.0000 mg | ORAL_CAPSULE | Freq: Two times a day (BID) | ORAL | Status: DC

## 2023-04-21 MED ORDER — KETOROLAC TROMETHAMINE 30 MG/ML IJ SOLN
INTRAMUSCULAR | Status: DC | PRN
  Administered 2023-04-21: 30 mg

## 2023-04-21 MED ORDER — WATER FOR IRRIGATION, STERILE IR SOLN
Status: DC | PRN
  Administered 2023-04-21: 2000 mL

## 2023-04-21 MED ORDER — ACETAMINOPHEN 160 MG/5ML PO SOLN: 325.0000 mg | ORAL | Status: DC | PRN

## 2023-04-21 MED ORDER — TRANEXAMIC ACID-NACL 1000-0.7 MG/100ML-% IV SOLN
1000.0000 mg | INTRAVENOUS | Status: AC
  Administered 2023-04-21: 1000 mg via INTRAVENOUS
  Filled 2023-04-21: qty 100

## 2023-04-21 MED ORDER — HYDROCODONE-ACETAMINOPHEN 5-325 MG PO TABS: 1.0000 | ORAL_TABLET | ORAL | 0 refills | Status: AC | PRN

## 2023-04-21 MED ORDER — MORPHINE SULFATE (PF) 2 MG/ML IV SOLN: 0.5000 mg | INTRAVENOUS | Status: DC | PRN

## 2023-04-21 SURGICAL SUPPLY — 60 items
ACE SHELL 3H 52 E HIP (Shell) ×1 IMPLANT
ADH SKN CLS APL DERMABOND .7 (GAUZE/BANDAGES/DRESSINGS) ×1
APL PRP STRL LF DISP 70% ISPRP (MISCELLANEOUS) ×1
BAG COUNTER SPONGE SURGICOUNT (BAG) IMPLANT
BAG DECANTER FOR FLEXI CONT (MISCELLANEOUS) IMPLANT
BAG SPEC THK2 15X12 ZIP CLS (MISCELLANEOUS)
BAG SPNG CNTER NS LX DISP (BAG)
BAG ZIPLOCK 12X15 (MISCELLANEOUS) IMPLANT
CHLORAPREP W/TINT 26 (MISCELLANEOUS) ×1 IMPLANT
COVER PERINEAL POST (MISCELLANEOUS) ×1 IMPLANT
COVER SURGICAL LIGHT HANDLE (MISCELLANEOUS) ×1 IMPLANT
DERMABOND ADVANCED .7 DNX12 (GAUZE/BANDAGES/DRESSINGS) ×2 IMPLANT
DRAPE IMP U-DRAPE 54X76 (DRAPES) ×1 IMPLANT
DRAPE SHEET LG 3/4 BI-LAMINATE (DRAPES) ×3 IMPLANT
DRAPE STERI IOBAN 125X83 (DRAPES) ×1 IMPLANT
DRAPE U-SHAPE 47X51 STRL (DRAPES) ×1 IMPLANT
DRESSING AQUACEL AG SP 3.5X10 (GAUZE/BANDAGES/DRESSINGS) IMPLANT
DRSG AQUACEL AG ADV 3.5X10 (GAUZE/BANDAGES/DRESSINGS) ×1 IMPLANT
DRSG AQUACEL AG SP 3.5X10 (GAUZE/BANDAGES/DRESSINGS) ×1
ELECT REM PT RETURN 15FT ADLT (MISCELLANEOUS) ×1 IMPLANT
GAUZE SPONGE 4X4 12PLY STRL (GAUZE/BANDAGES/DRESSINGS) ×1 IMPLANT
GLOVE BIO SURGEON STRL SZ7 (GLOVE) ×1 IMPLANT
GLOVE BIO SURGEON STRL SZ8.5 (GLOVE) ×2 IMPLANT
GLOVE BIOGEL PI IND STRL 7.5 (GLOVE) ×1 IMPLANT
GLOVE BIOGEL PI IND STRL 8.5 (GLOVE) ×1 IMPLANT
GOWN SPEC L3 XXLG W/TWL (GOWN DISPOSABLE) ×1 IMPLANT
GOWN STRL REUS W/ TWL XL LVL3 (GOWN DISPOSABLE) ×1 IMPLANT
GOWN STRL REUS W/TWL XL LVL3 (GOWN DISPOSABLE) ×1
HANDPIECE INTERPULSE COAX TIP (DISPOSABLE) ×1
HEAD CERAMIC BIOLOX 36MM (Head) IMPLANT
HIP SLEEVE BIOLOX -6MM OFFSET (Sleeve) ×1 IMPLANT
HOLDER FOLEY CATH W/STRAP (MISCELLANEOUS) ×1 IMPLANT
HOOD PEEL AWAY T7 (MISCELLANEOUS) ×3 IMPLANT
KIT TURNOVER KIT A (KITS) IMPLANT
LINER ACE G7 HIGH 36 SZ E (Liner) IMPLANT
MANIFOLD NEPTUNE II (INSTRUMENTS) ×1 IMPLANT
MARKER SKIN DUAL TIP RULER LAB (MISCELLANEOUS) ×1 IMPLANT
NDL SAFETY ECLIP 18X1.5 (MISCELLANEOUS) ×1 IMPLANT
NDL SPNL 18GX3.5 QUINCKE PK (NEEDLE) ×1 IMPLANT
NEEDLE SPNL 18GX3.5 QUINCKE PK (NEEDLE) ×1 IMPLANT
PACK ANTERIOR HIP CUSTOM (KITS) ×1 IMPLANT
SAW OSC TIP CART 19.5X105X1.3 (SAW) ×1 IMPLANT
SEALER BIPOLAR AQUA 6.0 (INSTRUMENTS) ×1 IMPLANT
SET HNDPC FAN SPRY TIP SCT (DISPOSABLE) ×1 IMPLANT
SHELL ACETAB 3H 52 E HIP (Shell) IMPLANT
SLEEVE HIP BIOLOX -6MM OFFSET (Sleeve) IMPLANT
SOLUTION PRONTOSAN WOUND 350ML (IRRIGATION / IRRIGATOR) ×1 IMPLANT
SPIKE FLUID TRANSFER (MISCELLANEOUS) ×1 IMPLANT
STEM FEM CMTLS 9X102.5 133D (Stem) IMPLANT
SUT MNCRL AB 3-0 PS2 18 (SUTURE) ×1 IMPLANT
SUT MON AB 2-0 CT1 36 (SUTURE) ×1 IMPLANT
SUT STRATAFIX PDO 1 14 VIOLET (SUTURE) ×1
SUT STRATFX PDO 1 14 VIOLET (SUTURE) ×1
SUT VIC AB 2-0 CT1 27 (SUTURE)
SUT VIC AB 2-0 CT1 TAPERPNT 27 (SUTURE) IMPLANT
SUTURE STRATFX PDO 1 14 VIOLET (SUTURE) ×1 IMPLANT
SYR 3ML LL SCALE MARK (SYRINGE) ×1 IMPLANT
TRAY FOLEY MTR SLVR 16FR STAT (SET/KITS/TRAYS/PACK) IMPLANT
TUBE SUCTION HIGH CAP CLEAR NV (SUCTIONS) ×1 IMPLANT
WATER STERILE IRR 1000ML POUR (IV SOLUTION) ×1 IMPLANT

## 2023-04-21 NOTE — Discharge Instructions (Signed)
? ?Dr. Brian Swinteck ?Joint Replacement Specialist ?[facility] ?[address] ?, [place] [address] ?[phone] ? ? ?TOTAL HIP REPLACEMENT POSTOPERATIVE DIRECTIONS ? ? ? ?Hip Rehabilitation, Guidelines Following Surgery  ? ?WEIGHT BEARING ?Weight bearing as tolerated with assist device (walker, cane, etc) as directed, use it as long as suggested by your surgeon or therapist, typically at least 4-6 weeks. ? ?The results of a hip operation are greatly improved after range of motion and muscle strengthening exercises. Follow all safety measures which are given to protect your hip. If any of these exercises cause increased pain or swelling in your joint, decrease the amount until you are comfortable again. Then slowly increase the exercises. Call your caregiver if you have problems or questions.  ? ?HOME CARE INSTRUCTIONS  ?Most of the following instructions are designed to prevent the dislocation of your new hip.  ?Remove items at home which could result in a fall. This includes throw rugs or furniture in walking pathways.  ?Continue medications as instructed at time of discharge. ?You may have some home medications which will be placed on hold until you complete the course of blood thinner medication. ?You may start showering once you are discharged home. Do not remove your dressing. ?Do not put on socks or shoes without following the instructions of your caregivers.   ?Sit on chairs with arms. Use the chair arms to help push yourself up when arising.  ?Arrange for the use of a toilet seat elevator so you are not sitting low.  ?Walk with walker as instructed.  ?You may resume a sexual relationship in one month or when given the OK by your caregiver.  ?Use walker as long as suggested by your caregivers.  ?You may put full weight on your legs and walk as much as is comfortable. ?Avoid periods of inactivity such as sitting longer than an hour when not asleep. This helps prevent blood  clots.  ?You may return to work once you are cleared by your surgeon.  ?Do not drive a car for 6 weeks or until released by your surgeon.  ?Do not drive while taking narcotics.  ?Wear elastic stockings for two weeks following surgery during the day but you may remove then at night.  ?Make sure you keep all of your appointments after your operation with all of your doctors and caregivers. You should call the office at the above phone number and make an appointment for approximately two weeks after the date of your surgery. ?Please pick up a stool softener and laxative for home use as long as you are requiring pain medications. ?ICE to the affected hip every three hours for 30 minutes at a time and then as needed for pain and swelling. Continue to use ice on the hip for pain and swelling from surgery. You may notice swelling that will progress down to the foot and ankle.  This is normal after surgery.  Elevate the leg when you are not up walking on it.   ?It is important for you to complete the blood thinner medication as prescribed by your doctor. ?Continue to use the breathing machine which will help keep your temperature down.  It is common for your temperature to cycle up and down following surgery, especially at night when you are not up moving around and exerting yourself.  The breathing machine keeps your lungs expanded and your temperature down. ? ?RANGE OF MOTION AND STRENGTHENING EXERCISES  ?These exercises are designed to help you   keep full movement of your hip joint. Follow your caregiver's or physical therapist's instructions. Perform all exercises about fifteen times, three times per day or as directed. Exercise both hips, even if you have had only one joint replacement. These exercises can be done on a training (exercise) mat, on the floor, on a table or on a bed. Use whatever works the best and is most comfortable for you. Use music or television while you are exercising so that the exercises are a  pleasant break in your day. This will make your life better with the exercises acting as a break in routine you can look forward to.  ?Lying on your back, slowly slide your foot toward your buttocks, raising your knee up off the floor. Then slowly slide your foot back down until your leg is straight again.  ?Lying on your back spread your legs as far apart as you can without causing discomfort.  ?Lying on your side, raise your upper leg and foot straight up from the floor as far as is comfortable. Slowly lower the leg and repeat.  ?Lying on your back, tighten up the muscle in the front of your thigh (quadriceps muscles). You can do this by keeping your leg straight and trying to raise your heel off the floor. This helps strengthen the largest muscle supporting your knee.  ?Lying on your back, tighten up the muscles of your buttocks both with the legs straight and with the knee bent at a comfortable angle while keeping your heel on the floor.  ? ?SKILLED REHAB INSTRUCTIONS: ?If the patient is transferred to a skilled rehab facility following release from the hospital, a list of the current medications will be sent to the facility for the patient to continue.  When discharged from the skilled rehab facility, please have the facility set up the patient's Home Health Physical Therapy prior to being released. Also, the skilled facility will be responsible for providing the patient with their medications at time of release from the facility to include their pain medication and their blood thinner medication. If the patient is still at the rehab facility at time of the two week follow up appointment, the skilled rehab facility will also need to assist the patient in arranging follow up appointment in our office and any transportation needs. ? ?POST-OPERATIVE OPIOID TAPER INSTRUCTIONS: ?It is important to wean off of your opioid medication as soon as possible. If you do not need pain medication after your surgery it is ok  to stop day one. ?Opioids include: ?Codeine, Hydrocodone(Norco, Vicodin), Oxycodone(Percocet, oxycontin) and hydromorphone amongst others.  ?Long term and even short term use of opiods can cause: ?Increased pain response ?Dependence ?Constipation ?Depression ?Respiratory depression ?And more.  ?Withdrawal symptoms can include ?Flu like symptoms ?Nausea, vomiting ?And more ?Techniques to manage these symptoms ?Hydrate well ?Eat regular healthy meals ?Stay active ?Use relaxation techniques(deep breathing, meditating, yoga) ?Do Not substitute Alcohol to help with tapering ?If you have been on opioids for less than two weeks and do not have pain than it is ok to stop all together.  ?Plan to wean off of opioids ?This plan should start within one week post op of your joint replacement. ?Maintain the same interval or time between taking each dose and first decrease the dose.  ?Cut the total daily intake of opioids by one tablet each day ?Next start to increase the time between doses. ?The last dose that should be eliminated is the evening dose.  ? ? ?MAKE   SURE YOU:  ?Understand these instructions.  ?Will watch your condition.  ?Will get help right away if you are not doing well or get worse. ? ?Pick up stool softner and laxative for home use following surgery while on pain medications. ?Do not remove your dressing. ?The dressing is waterproof--it is OK to take showers. ?Continue to use ice for pain and swelling after surgery. ?Do not use any lotions or creams on the incision until instructed by your surgeon. ?Total Hip Protocol. ? ?

## 2023-04-21 NOTE — Op Note (Signed)
OPERATIVE REPORT   SURGEON: Samson Frederic, MD    ASSISTANT: Clint Bolder, PA-C.   PREOPERATIVE DIAGNOSIS: Right hip arthritis with protrusio deformity.    POSTOPERATIVE DIAGNOSIS: Right hip arthritis with protrusio deformity.    PROCEDURE: Right total hip arthroplasty, anterior approach.    IMPLANTS: Biomet Taperloc Complete Microplasty stem, size 9 x 102.5 mm, high offset. Biomet G7 OsseoTi Cup, size 52 mm. Biomet Vivacit-E liner, size 36 mm, E,  neutral. Biomet Biolox ceramic head ball, size 36 mm with -6mm taper adapter.   ANESTHESIA:  MAC and Spinal  ANESTHESIA:  MAC and Spinal  ESTIMATED BLOOD LOSS:-500 mL    ANTIBIOTICS: 2 g Ancef.  DRAINS: None.  COMPLICATIONS: None.   CONDITION: PACU - hemodynamically stable.   BRIEF CLINICAL NOTE: Margaret Cisneros is a 72 y.o. female with a long-standing history of Right hip arthritis. After failing conservative management, the patient was indicated for total hip arthroplasty. The risks, benefits, and alternatives to the procedure were explained, and the patient elected to proceed.  PROCEDURE IN DETAIL: Surgical site was marked by myself in the pre-op holding area. Once inside the operating room, spinal anesthesia was obtained, and a foley catheter was inserted. The patient was then positioned on the Hana table.  All bony prominences were well padded.  The hip was prepped and draped in the normal sterile surgical fashion.  A time-out was called verifying side and site of surgery. The patient received IV antibiotics within 60 minutes of beginning the procedure.   Bikini incision was made, and superficial dissection was performed lateral to the ASIS. The direct anterior approach to the hip was performed through the Hueter interval.  Lateral femoral circumflex vessels were treated with the Auqumantys. The anterior capsule was exposed and an inverted T capsulotomy was made. The femoral neck cut was made to the level of the templated cut.  A  corkscrew was placed into the head and the head was removed.  The femoral head was found to have eburnated bone. The head was passed to the back table and was measured. Pubofemoral ligament was released off of the calcar, taking care to stay on bone. Superior capsule was released from the greater trochanter, taking care to stay lateral to the posterior border of the femoral neck in order to preserve the short external rotators.   Acetabular exposure was achieved, and the pulvinar and labrum were excised. Sequential reaming of the acetabulum was then performed up to a size 51 mm reamer. A 52 mm cup was then opened and impacted into place at approximately 40 degrees of abduction and 20 degrees of anteversion. The final polyethylene liner was impacted into place and acetabular osteophytes were removed.    I then gained femoral exposure taking care to protect the abductors and greater trochanter.  This was performed using standard external rotation, extension, and adduction.  A cookie cutter was used to enter the femoral canal, and then the femoral canal finder was placed.  Sequential broaching was performed up to a size 9.  Calcar planer was used on the femoral neck remnant.  I placed a high offset neck and a trial head ball.  The hip was reduced.  Leg lengths and offset were checked fluoroscopically.  The hip was dislocated and trial components were removed.  The final implants were placed, and the hip was reduced.  Fluoroscopy was used to confirm component position and leg lengths.  At 90 degrees of external rotation and full extension, the hip was  stable to an anterior directed force.   The wound was copiously irrigated with Prontosan solution and normal saline using pule lavage.  Marcaine solution was injected into the periarticular soft tissue.  The wound was closed in layers using #1 Vicryl and V-Loc for the fascia, 2-0 Vicryl for the subcutaneous fat, 2-0 Monocryl for the deep dermal layer, 3-0 running  Monocryl subcuticular stitch, and Dermabond for the skin.  Once the glue was fully dried, an Aquacell Ag dressing was applied.  The patient was transported to the recovery room in stable condition.  Sponge, needle, and instrument counts were correct at the end of the case x2.  The patient tolerated the procedure well and there were no known complications.   The aquamantis was utilized for this case to help facilitate better hemostasis as patient was felt to be at increased risk of bleeding because of obesity.   A oscillating saw tip was utilized for this case to prevent damage to the soft tissue structures such as muscles, ligaments and tendons, and to ensure accurate bone cuts. This patient was at increased risk for above structures due to  minimally invasive approach.   Please note that a surgical assistant was a medical necessity for this procedure to perform it in a safe and expeditious manner. Assistant was necessary to provide appropriate retraction of vital neurovascular structures, to prevent femoral fracture, and to allow for anatomic placement of the prosthesis.

## 2023-04-21 NOTE — Transfer of Care (Signed)
Immediate Anesthesia Transfer of Care Note  Patient: KENSLEIGH SHAWVER  Procedure(s) Performed: TOTAL HIP ARTHROPLASTY ANTERIOR APPROACH (Right: Hip)  Patient Location: PACU  Anesthesia Type:Spinal  Level of Consciousness: awake, alert , oriented, and patient cooperative  Airway & Oxygen Therapy: Patient Spontanous Breathing and Patient connected to face mask oxygen  Post-op Assessment: Report given to RN and Post -op Vital signs reviewed and stable  Post vital signs: Reviewed and stable  Last Vitals:  Vitals Value Taken Time  BP 113/86 04/21/23 1000  Temp    Pulse 110 04/21/23 1003  Resp 22 04/21/23 1003  SpO2 92 % 04/21/23 1003  Vitals shown include unvalidated device data.  Last Pain:  Vitals:   04/21/23 0646  TempSrc:   PainSc: 0-No pain         Complications: No notable events documented.

## 2023-04-21 NOTE — Anesthesia Postprocedure Evaluation (Signed)
Anesthesia Post Note  Patient: [name]  Procedure(s) Performed: TOTAL HIP ARTHROPLASTY ANTERIOR APPROACH (Right: Hip)     Patient location during evaluation: PACU Anesthesia Type: MAC and Spinal Level of consciousness: awake and alert Pain management: pain level controlled Vital Signs Assessment: post-procedure vital signs reviewed and stable Respiratory status: spontaneous breathing, nonlabored ventilation, respiratory function stable and patient connected to nasal cannula oxygen Cardiovascular status: blood pressure returned to baseline and stable Postop Assessment: no apparent nausea or vomiting Anesthetic complications: no   No notable events documented.  Last Vitals:  Vitals:   04/21/23 1155 04/21/23 1201  BP: (!) 171/102 (!) 166/100  Pulse: 100 100  Resp:    Temp: 36.6 C   SpO2: 94% 92%    Last Pain:  Vitals:   04/21/23 1300  TempSrc:   PainSc: 5                  [name]

## 2023-04-21 NOTE — Interval H&P Note (Signed)
History and Physical Interval Note:  04/21/2023 7:32 AM  Margaret Cisneros  has presented today for surgery, with the diagnosis of Right hip osteoarthritis.  The various methods of treatment have been discussed with the patient and family. After consideration of risks, benefits and other options for treatment, the patient has consented to  Procedure(s) with comments: TOTAL HIP ARTHROPLASTY ANTERIOR APPROACH (Right) - 150 as a surgical intervention.  The patient's history has been reviewed, patient examined, no change in status, stable for surgery.  I have reviewed the patient's chart and labs.  Questions were answered to the patient's satisfaction.     [name] [name]

## 2023-04-21 NOTE — Evaluation (Signed)
Physical Therapy Evaluation Patient Details Name: Margaret Cisneros MRN: [id number] DOB: 12-14-1950 Today's Date: 04/21/2023  History of Present Illness  72 yo female S/P R DATHA  04/21/23.  PMH:  Bil TKA, LTHA, DM HTN  Clinical Impression  The patient tolerated ambulation x 50' with complaints of right thigh burning. After toileting, reports burning excalated and recline brought up. RN aware of burning pain.  Patient is eager to Dc home. Patient  instructed to not negotiate steps today but to use her level entrance.  Patient's sister present and assured that she will use level  entrance.  Patient  has met goals to Dc home if right  thigh pain  does not become an issue.     Recommendations for follow up therapy are one component of a multi-disciplinary discharge planning process, led by the attending physician.  Recommendations may be updated based on patient status, additional functional criteria and insurance authorization.  Follow Up Recommendations       Assistance Recommended at Discharge Intermittent Supervision/Assistance  Patient can return home with the following  A little help with walking and/or transfers;Assistance with cooking/housework;Assist for transportation;Help with stairs or ramp for entrance;A little help with bathing/dressing/bathroom    Equipment Recommendations Rolling walker (2 wheels)  Recommendations for Other Services       Functional Status Assessment Patient has had a recent decline in their functional status and demonstrates the ability to make significant improvements in function in a reasonable and predictable amount of time.     Precautions / Restrictions Precautions Precautions: Fall      Mobility  Bed Mobility Overal bed mobility: Needs Assistance Bed Mobility: Supine to Sit     Supine to sit: Min assist     General bed mobility comments: assist with Right leg    Transfers Overall transfer level: Needs assistance Equipment used:  Rolling walker (2 wheels) Transfers: Sit to/from Stand Sit to Stand: Min assist           General transfer comment: cues forhand and right leg, use of rail from low toilet    Ambulation/Gait Ambulation/Gait assistance: Min assist Gait Distance (Feet): 50 Feet (then 10) Assistive device: Rolling walker (2 wheels) Gait Pattern/deviations: Step-to pattern, Step-through pattern Gait velocity: decr     General Gait Details: patient had onset of right thigh burning, able to get to BE but required recliner  brought after toileting due to burning  Stairs            Wheelchair Mobility    Modified Rankin (Stroke Patients Only)       Balance Overall balance assessment: Mild deficits observed, not formally tested                                           Pertinent Vitals/Pain Pain Assessment Pain Assessment: 0-10 Pain Score: 8  Pain Location: right thigh Pain Descriptors / Indicators: Burning Pain Intervention(s): Monitored during session, Premedicated before session, Ice applied    Home Living Family/patient expects to be discharged to:: Private residence Living Arrangements: Children;Other relatives Available Help at Discharge: Family Type of Home: Apartment Home Access: Stairs to enter Entrance Stairs-Rails: Left Entrance Stairs-Number of Steps: daown 14   Home Layout: One level   Additional Comments: lives wuith son    Prior Function Prior Level of Function : Independent/Modified Independent  Hand Dominance        Extremity/Trunk Assessment   Upper Extremity Assessment Upper Extremity Assessment: Overall WFL for tasks assessed    Lower Extremity Assessment Lower Extremity Assessment: RLE deficits/detail RLE Deficits / Details: able to  advance the leg,    Cervical / Trunk Assessment Cervical / Trunk Assessment: Normal  Communication   Communication: No difficulties  Cognition Arousal/Alertness:  Awake/alert Behavior During Therapy: WFL for tasks assessed/performed Overall Cognitive Status: Within Functional Limits for tasks assessed                                          General Comments      Exercises Total Joint Exercises Ankle Circles/Pumps: AROM, Both, 10 reps Quad Sets: AROM, Both, 10 reps, Supine Heel Slides: AAROM, Right, 5 reps, Supine Hip ABduction/ADduction: AAROM, Right, 5 reps, Supine Long Arc Quad: AROM, Right, 10 reps, Seated   Assessment/Plan    PT Assessment Patient does not need any further PT services  PT Problem List Decreased strength;Decreased mobility;Decreased range of motion;Decreased activity tolerance;Pain       PT Treatment Interventions      PT Goals (Current goals can be found in the Care Plan section)  Acute Rehab PT Goals Patient Stated Goal: go home PT Goal Formulation: All assessment and education complete, DC therapy    Frequency       Co-evaluation               AM-PAC PT "6 Clicks" Mobility  Outcome Measure Help needed turning from your back to your side while in a flat bed without using bedrails?: A Little Help needed moving from lying on your back to sitting on the side of a flat bed without using bedrails?: A Little Help needed moving to and from a bed to a chair (including a wheelchair)?: A Little Help needed standing up from a chair using your arms (e.g., wheelchair or bedside chair)?: A Little Help needed to walk in hospital room?: A Little Help needed climbing 3-5 steps with a railing? : A Lot 6 Click Score: 17    End of Session Equipment Utilized During Treatment: Gait belt Activity Tolerance: Patient tolerated treatment well;Patient limited by pain Patient left: in chair;with call bell/phone within reach;with nursing/sitter in room Nurse Communication: Mobility status PT Visit Diagnosis: Unsteadiness on feet (R26.81)    Time: 1250-1330 PT Time Calculation (min) (ACUTE ONLY): 40  min   Charges:   PT Evaluation $PT Eval Low Complexity: 1 Low PT Treatments $Gait Training: 8-22 mins $Self Care/Home Management: 8-22        Blanchard Kelch PT Acute Rehabilitation Services Office [phone] Weekend [phone]   Rada Hay 04/21/2023, 3:03 PM

## 2023-04-22 ENCOUNTER — Encounter (HOSPITAL_COMMUNITY): Payer: Self-pay | Admitting: Orthopedic Surgery

## 2023-05-06 DIAGNOSIS — Z96641 Presence of right artificial hip joint: Secondary | ICD-10-CM | POA: Diagnosis not present

## 2023-05-06 DIAGNOSIS — Z471 Aftercare following joint replacement surgery: Secondary | ICD-10-CM | POA: Diagnosis not present

## 2023-05-26 DIAGNOSIS — M9903 Segmental and somatic dysfunction of lumbar region: Secondary | ICD-10-CM | POA: Diagnosis not present

## 2023-05-26 DIAGNOSIS — M9902 Segmental and somatic dysfunction of thoracic region: Secondary | ICD-10-CM | POA: Diagnosis not present

## 2023-05-26 DIAGNOSIS — M50322 Other cervical disc degeneration at C5-C6 level: Secondary | ICD-10-CM | POA: Diagnosis not present

## 2023-05-26 DIAGNOSIS — M542 Cervicalgia: Secondary | ICD-10-CM | POA: Diagnosis not present

## 2023-05-26 DIAGNOSIS — M9901 Segmental and somatic dysfunction of cervical region: Secondary | ICD-10-CM | POA: Diagnosis not present

## 2023-05-26 DIAGNOSIS — M50323 Other cervical disc degeneration at C6-C7 level: Secondary | ICD-10-CM | POA: Diagnosis not present

## 2023-07-14 IMAGING — MG MM DIGITAL SCREENING BILAT W/ TOMO AND CAD
8 series · 8 of 24 positions shown · non-contrast
Comparison: Previous exam(s).

CLINICAL DATA: Screening.

EXAM:
DIGITAL SCREENING BILATERAL MAMMOGRAM WITH TOMOSYNTHESIS AND CAD
TECHNIQUE: Bilateral screening digital craniocaudal and mediolateral oblique
mammograms were obtained. Bilateral screening digital breast
tomosynthesis was performed. The images were evaluated with
computer-aided detection.

[R MLO synth-2D]
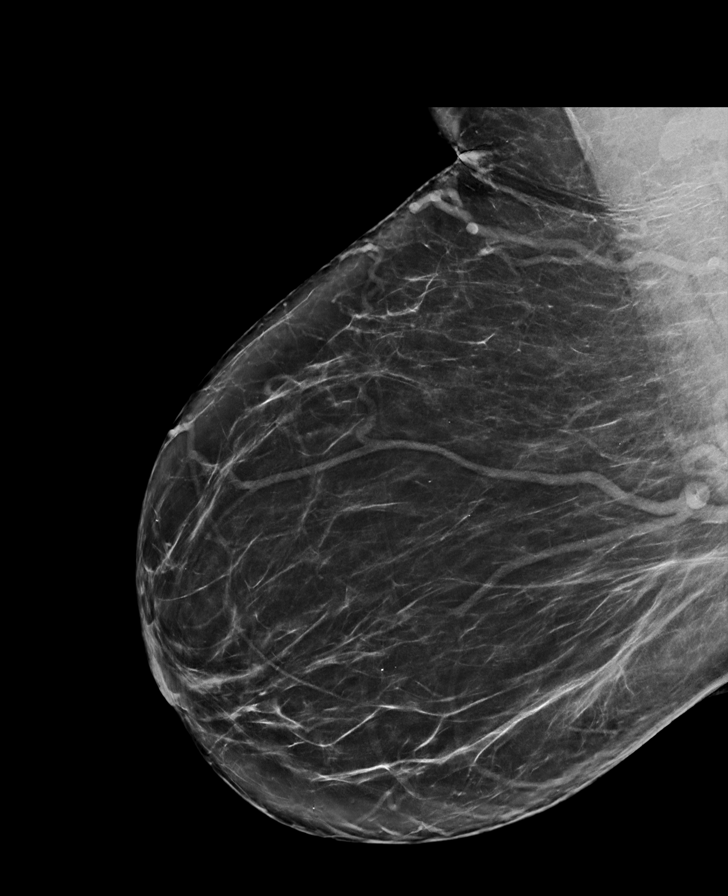

[L CC synth-2D]
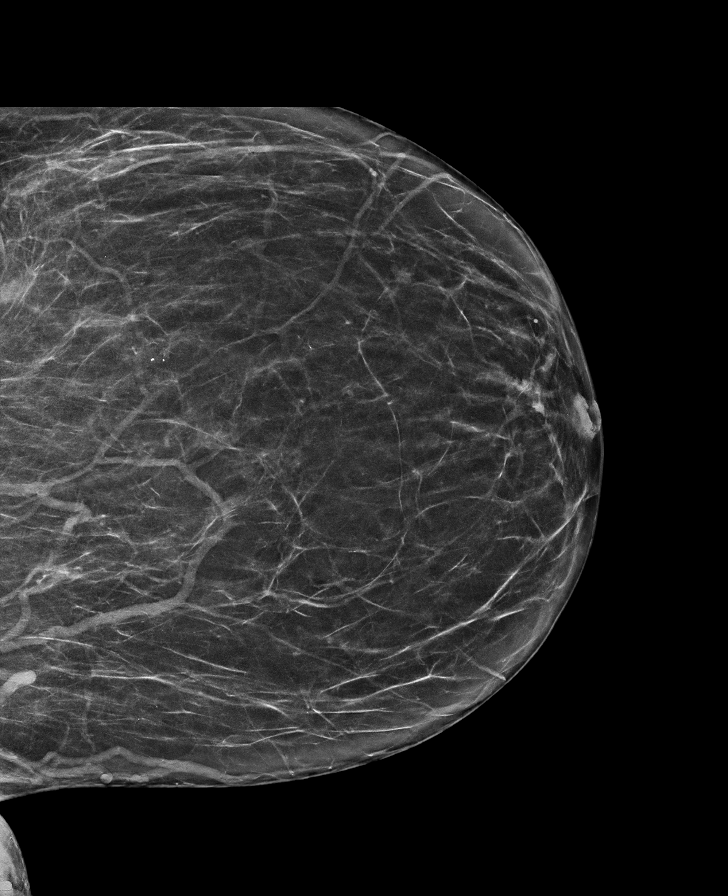

[R CC synth-2D]
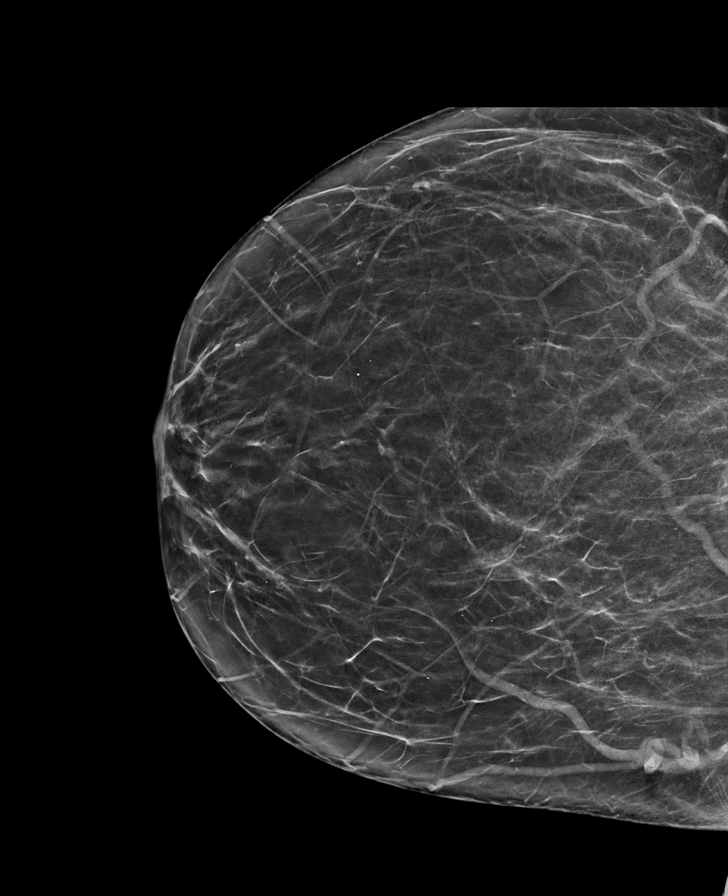

[L MLO synth-2D]
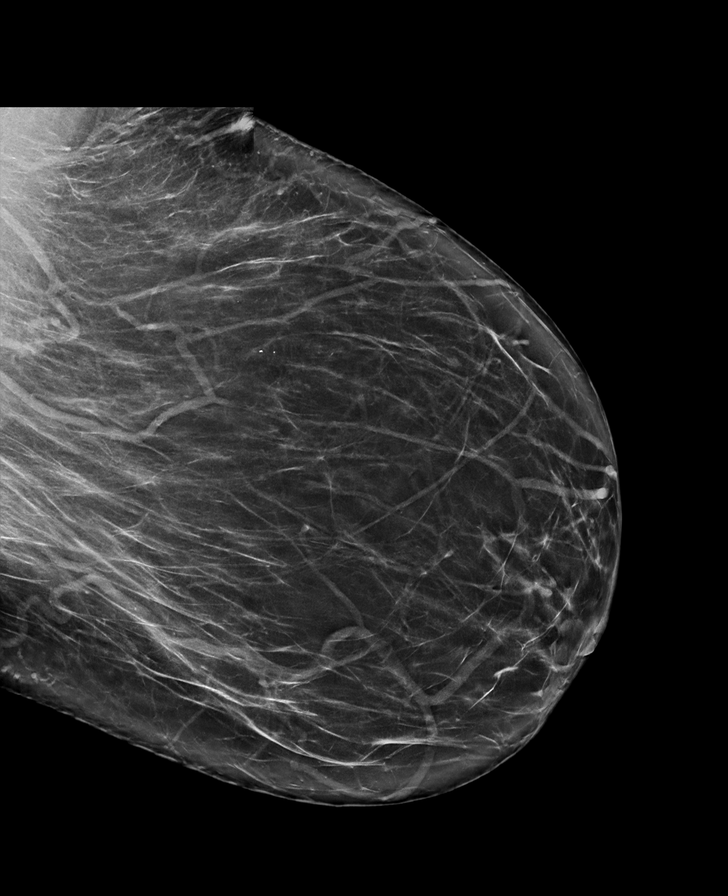

[R MLO tomo · tomo slice 43/86.0]
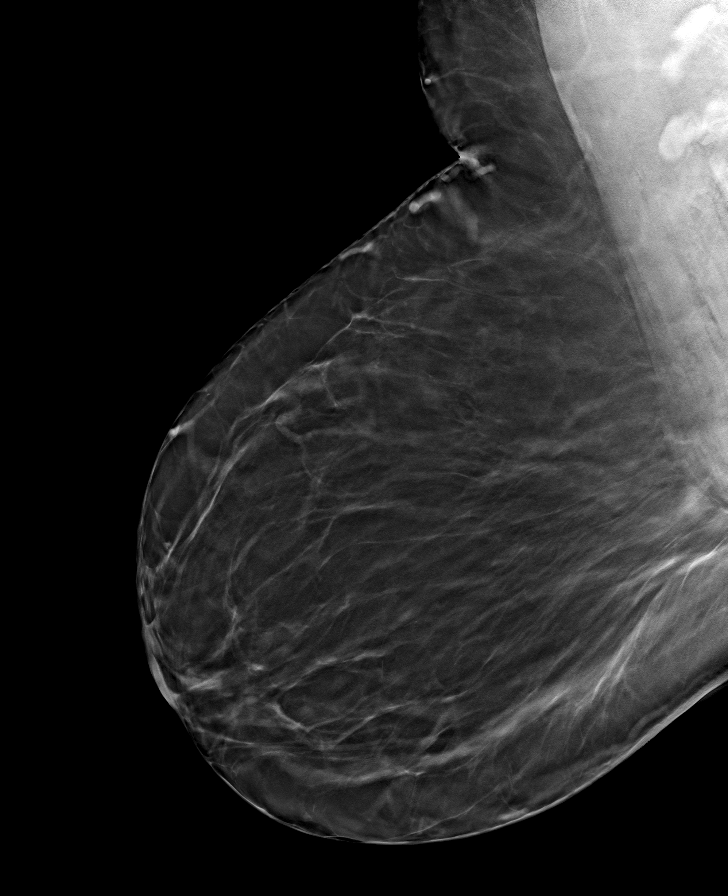

[R CC tomo · tomo slice 33/66.0]
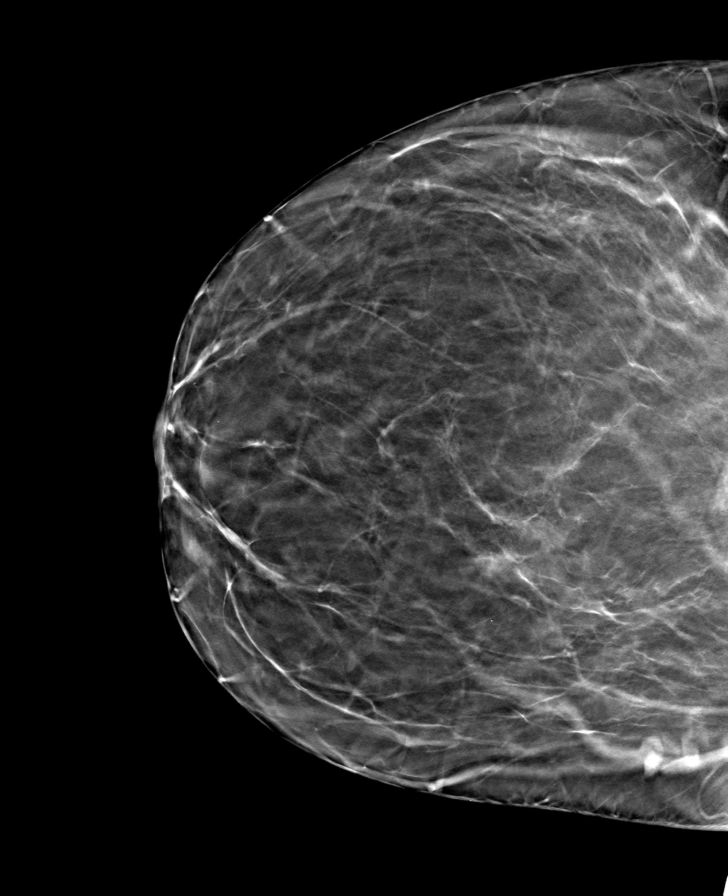

[L CC tomo · tomo slice 34/67.0]
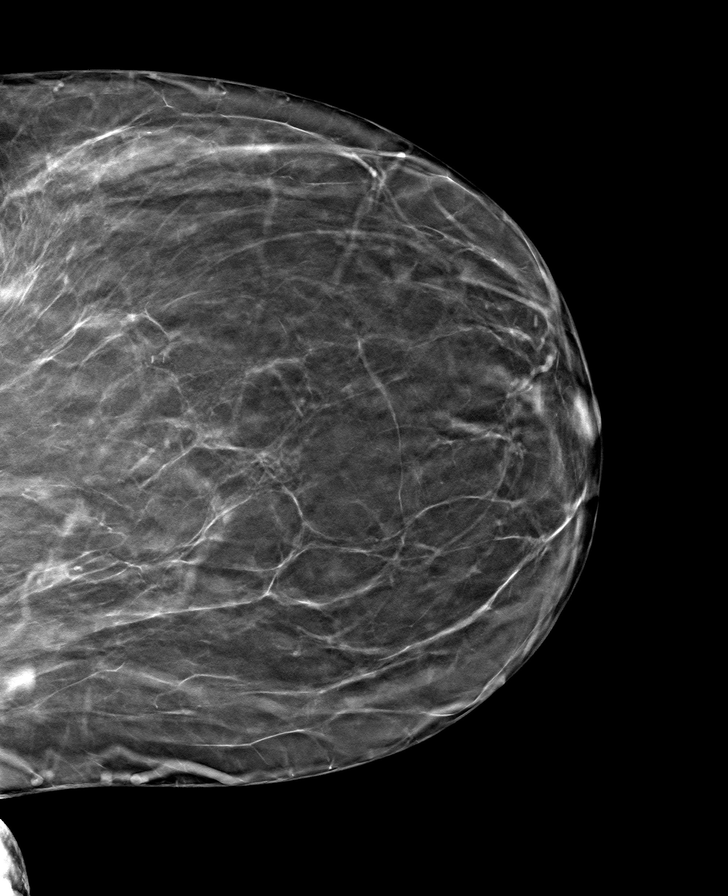

[L MLO tomo · tomo slice 41/81.0]
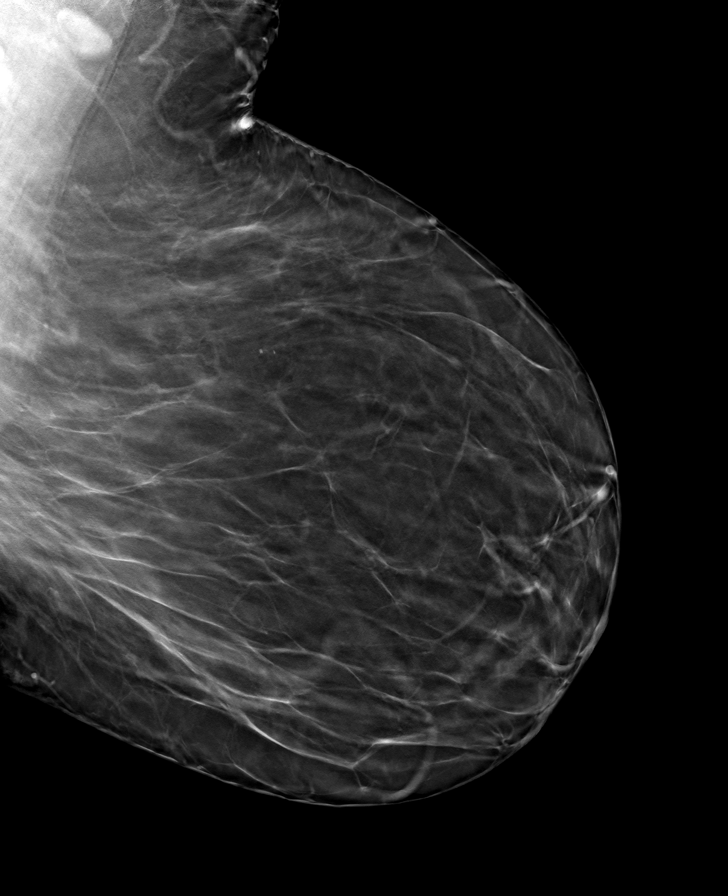

[8 of 24 positions shown; findings below may reference images not displayed]

ACR Breast Density Category b: There are scattered areas of
fibroglandular density.
FINDINGS: There are no findings suspicious for malignancy.
IMPRESSION: No mammographic evidence of malignancy. A result letter of this
screening mammogram will be mailed directly to the patient.

RECOMMENDATION:
Screening mammogram in one year. (Code:51-O-LD2)

BI-RADS CATEGORY  1: Negative.

## 2024-01-04 ENCOUNTER — Other Ambulatory Visit: Payer: Self-pay | Admitting: Internal Medicine

## 2024-01-04 DIAGNOSIS — Z1231 Encounter for screening mammogram for malignant neoplasm of breast: Secondary | ICD-10-CM

## 2024-02-07 ENCOUNTER — Ambulatory Visit: Payer: Medicare HMO

## 2024-02-08 ENCOUNTER — Ambulatory Visit

## 2024-02-17 ENCOUNTER — Ambulatory Visit

## 2024-03-02 ENCOUNTER — Ambulatory Visit
Admission: RE | Admit: 2024-03-02 | Discharge: 2024-03-02 | Disposition: A | Discharge status: Home / Self Care | Source: Ambulatory Visit | Attending: Internal Medicine | Admitting: Internal Medicine

## 2024-03-02 DIAGNOSIS — Z1231 Encounter for screening mammogram for malignant neoplasm of breast: Secondary | ICD-10-CM

## 2024-07-31 ENCOUNTER — Other Ambulatory Visit (HOSPITAL_COMMUNITY): Payer: Self-pay
